# Patient Record
Sex: Female | Born: 1965
Health system: Southern US, Community
[De-identification: ages and names within clinical notes are randomized; demographics above are authoritative.]

## PROBLEM LIST (undated history)

## (undated) ENCOUNTER — Emergency Department (HOSPITAL_COMMUNITY): Admission: EM | Payer: Managed Care, Other (non HMO) | Source: Home / Self Care

## (undated) DIAGNOSIS — N898 Other specified noninflammatory disorders of vagina: Secondary | ICD-10-CM

## (undated) DIAGNOSIS — K649 Unspecified hemorrhoids: Secondary | ICD-10-CM

## (undated) DIAGNOSIS — M549 Dorsalgia, unspecified: Secondary | ICD-10-CM

## (undated) DIAGNOSIS — Z79890 Hormone replacement therapy (postmenopausal): Secondary | ICD-10-CM

## (undated) DIAGNOSIS — A4902 Methicillin resistant Staphylococcus aureus infection, unspecified site: Secondary | ICD-10-CM

## (undated) DIAGNOSIS — G8929 Other chronic pain: Secondary | ICD-10-CM

## (undated) DIAGNOSIS — J069 Acute upper respiratory infection, unspecified: Secondary | ICD-10-CM

## (undated) DIAGNOSIS — N2889 Other specified disorders of kidney and ureter: Secondary | ICD-10-CM

## (undated) DIAGNOSIS — R252 Cramp and spasm: Secondary | ICD-10-CM

## (undated) DIAGNOSIS — B009 Herpesviral infection, unspecified: Secondary | ICD-10-CM

## (undated) DIAGNOSIS — Z309 Encounter for contraceptive management, unspecified: Secondary | ICD-10-CM

## (undated) DIAGNOSIS — T148XXA Other injury of unspecified body region, initial encounter: Secondary | ICD-10-CM

## (undated) DIAGNOSIS — M1990 Unspecified osteoarthritis, unspecified site: Secondary | ICD-10-CM

## (undated) DIAGNOSIS — Z8619 Personal history of other infectious and parasitic diseases: Secondary | ICD-10-CM

## (undated) HISTORY — DX: Herpesviral infection, unspecified: B00.9

## (undated) HISTORY — DX: Encounter for contraceptive management, unspecified: Z30.9

## (undated) HISTORY — DX: Other specified disorders of kidney and ureter: N28.89

## (undated) HISTORY — DX: Other specified noninflammatory disorders of vagina: N89.8

## (undated) HISTORY — DX: Acute upper respiratory infection, unspecified: J06.9

## (undated) HISTORY — PX: ENDOMETRIAL ABLATION: SHX621

## (undated) HISTORY — PX: CHOLECYSTECTOMY: SHX55

## (undated) HISTORY — DX: Personal history of other infectious and parasitic diseases: Z86.19

## (undated) HISTORY — DX: Hormone replacement therapy: Z79.890

## (undated) HISTORY — DX: Cramp and spasm: R25.2

## (undated) HISTORY — DX: Methicillin resistant Staphylococcus aureus infection, unspecified site: A49.02

## (undated) HISTORY — DX: Unspecified hemorrhoids: K64.9

---

## 1992-12-03 DIAGNOSIS — T148XXA Other injury of unspecified body region, initial encounter: Secondary | ICD-10-CM

## 1992-12-03 HISTORY — DX: Other injury of unspecified body region, initial encounter: T14.8XXA

## 2001-09-29 ENCOUNTER — Other Ambulatory Visit: Admission: RE | Admit: 2001-09-29 | Discharge: 2001-09-29 | Payer: Self-pay | Admitting: Obstetrics and Gynecology

## 2003-08-31 ENCOUNTER — Ambulatory Visit (HOSPITAL_COMMUNITY): Admission: RE | Admit: 2003-08-31 | Discharge: 2003-08-31 | Payer: Self-pay | Admitting: Obstetrics & Gynecology

## 2004-10-27 ENCOUNTER — Emergency Department (HOSPITAL_COMMUNITY): Admission: EM | Admit: 2004-10-27 | Discharge: 2004-10-27 | Payer: Self-pay

## 2004-10-27 ENCOUNTER — Observation Stay (HOSPITAL_COMMUNITY): Admission: EM | Admit: 2004-10-27 | Discharge: 2004-10-28 | Payer: Self-pay | Admitting: Emergency Medicine

## 2008-09-25 ENCOUNTER — Emergency Department (HOSPITAL_COMMUNITY): Admission: EM | Admit: 2008-09-25 | Discharge: 2008-09-25 | Payer: Self-pay | Admitting: Emergency Medicine

## 2009-03-23 ENCOUNTER — Other Ambulatory Visit: Admission: RE | Admit: 2009-03-23 | Discharge: 2009-03-23 | Payer: Self-pay | Admitting: Obstetrics and Gynecology

## 2009-12-03 HISTORY — PX: NECK SURGERY: SHX720

## 2010-01-09 ENCOUNTER — Ambulatory Visit (HOSPITAL_COMMUNITY): Admission: RE | Admit: 2010-01-09 | Discharge: 2010-01-09 | Payer: Self-pay | Admitting: Family Medicine

## 2010-05-29 ENCOUNTER — Other Ambulatory Visit: Admission: RE | Admit: 2010-05-29 | Discharge: 2010-05-29 | Payer: Self-pay | Admitting: Obstetrics and Gynecology

## 2011-04-20 NOTE — Op Note (Signed)
   NAME:  Andrea Bailey, Andrea Bailey                            ACCOUNT NO.:  [id number]   MEDICAL RECORD NO.:  [id number]                   PATIENT TYPE:  AMB   LOCATION:  DAY                                  FACILITY:  [facility]   PHYSICIAN:  Lazaro Arms, M.D.                DATE OF BIRTH:  11/15/1966   DATE OF PROCEDURE:  08/31/2003  DATE OF DISCHARGE:                                 OPERATIVE REPORT   PREOPERATIVE DIAGNOSES:  1. Menometrorrhagia.  2. Dysmenorrhea.   POSTOPERATIVE DIAGNOSES:  1. Menometrorrhagia.  2. Dysmenorrhea.   PROCEDURE:  1. Hysteroscopy.  2. Dilatation and curettage with endometrial ablation.   SURGEON:  Lazaro Arms, M.D.   ANESTHESIA:  Laryngeal mask airway.   FINDINGS:  The patient had a normal uterus, tubes and ovaries.   DESCRIPTION OF OPERATION:  The patient was taken to the operating room and  placed in the supine position where she underwent general endotracheal  anesthesia.  She was placed in the low lithotomy position and prepped and  draped in the usual sterile fashion.  Her cervix was grasped with a single-  tooth tenaculum, 1/2% Marcaine was injected as a paracervical block, 20 cc  total.   The uterus was sounded to 9 cm.  The cervix was dilated serially to allow  passage of the hysteroscope.  Hysteroscopy was performed; it was normal  endometrial cavity.  The uterine curettage was then performed, sharply, with  a good uterine cry in all areas.  Endometrial ablation was then performed  with ThermaChoice 3.  The catheter was placed.  It required 16.5 cc of D5W  to attain a steady pressure of 190 mmHg.  It was then heated to 88 degrees  Celsius for 8 minutes.  Total therapy time of 9 minutes 32 seconds  All the  fluid was recovered at the end. The patient tolerated the procedure well.  She was awakened from anesthesia taken to the recovery room in good stable  condition. All counts were correct.       ___________________________________________                                            Lazaro Arms, M.D.   LHE/MEDQ  D:  08/31/2003  T:  08/31/2003  Job:  [id number]

## 2011-04-20 NOTE — Op Note (Signed)
Andrea Bailey, Andrea Bailey NO.:  [id number]   MEDICAL RECORD NO.:  [id number]          PATIENT TYPE:  OBV   LOCATION:  A307                          FACILITY:  [facility]   PHYSICIAN:  Dalia Heading, M.D.  DATE OF BIRTH:  1966-09-18   DATE OF PROCEDURE:  10/27/2004  DATE OF DISCHARGE:                                 OPERATIVE REPORT   PREOPERATIVE DIAGNOSIS:  Cholecystitis, cholelithiasis.   POSTOPERATIVE DIAGNOSIS:  Cholecystitis, cholelithiasis.   PROCEDURE:  Laparoscopic cholecystectomy.   SURGEON:  Dr. Franky Macho.   ANESTHESIA:  General endotracheal.   INDICATIONS:  The patient is a 45 year old white female who presents to the  emergency room with biliary colic. Ultrasound of the gallbladder reveals  cholecystitis with cholelithiasis. The patient common bile duct is within  normal limits. The risks and benefits of the procedure including bleeding,  infection, hepatobiliary injury, and the possibility of an open procedure  were fully explained to the patient who gave informed consent.   PROCEDURE NOTE:  The patient was placed in the supine position. After  induction of general endotracheal anesthesia, the abdomen was prepped and  draped using the usual sterile technique with Betadine. Surgical site  confirmation was performed.   Supraumbilical incision was made down to the fascia. Verres needle was  introduced into the abdominal cavity, and confirmation of placement was done  using the saline drop test. The abdomen was then insufflated to 16 mmHg  pressure. An 11-mm trocar was introduced into the abdominal cavity under  direct visualization without difficulty. The patient was placed in reversed  Trendelenburg position. An additional 11-mm trocar was placed in the  epigastric region. A 5-mm trocar was placed in the right upper quadrant,  right flank regions. Liver was inspected and noted to be within normal  limits. The gallbladder was noted to be inflamed and  distended. The  gallbladder was decompressed with a needle. The gallbladder was then  retracted superiorly and laterally. The dissection was begun around the  infundibulum of the gallbladder. The cystic duct was first identified. Its  juncture to the infundibulum identified. Endoclips were placed proximally  and distally on the cystic duct, and the cystic duct was divided. The cystic  artery was likewise ligated and divided. The gallbladder was freed away from  the gallbladder fossa using Bovie electrocautery. The gallbladder was  delivered through the epigastric trocar site using EndoCatch bag. The  gallbladder fossa was inspected, and no abnormal bleeding or bile leakage  was noted. Surgicel was placed in the gallbladder fossa. All fluid and air  was then evacuated from the abdominal cavity prior to removal of the  trocars.   All wounds were irrigated with normal saline. All wounds were injected with  0.5% Sensorcaine. The supraumbilical fascia as well as epigastric fascia  were reapproximated using an 0 Vicryl interrupted suture. All skin incisions  were closed using staples. Betadine ointment and dry sterile dressings were  applied.   All tape and needle counts correct at the end of the procedure. The patient  was extubated in the operating  room and went back to recovery room awake in  stable condition.   COMPLICATIONS:  None.   SPECIMENS:  Gallbladder with stones.   BLOOD LOSS:  MinimalLoraine Leriche   MAJ/MEDQ  D:  10/27/2004  T:  10/27/2004  Job:  [id number]

## 2011-05-05 ENCOUNTER — Inpatient Hospital Stay (INDEPENDENT_AMBULATORY_CARE_PROVIDER_SITE_OTHER)
Admission: RE | Admit: 2011-05-05 | Discharge: 2011-05-05 | Disposition: A | Discharge status: Home / Self Care | Payer: PRIVATE HEALTH INSURANCE | Source: Ambulatory Visit | Attending: Family Medicine | Admitting: Family Medicine

## 2011-05-05 DIAGNOSIS — M549 Dorsalgia, unspecified: Secondary | ICD-10-CM

## 2011-06-12 ENCOUNTER — Other Ambulatory Visit (HOSPITAL_COMMUNITY)
Admission: RE | Admit: 2011-06-12 | Discharge: 2011-06-12 | Disposition: A | Discharge status: Home / Self Care | Payer: PRIVATE HEALTH INSURANCE | Source: Ambulatory Visit | Attending: Obstetrics and Gynecology | Admitting: Obstetrics and Gynecology

## 2011-06-12 ENCOUNTER — Other Ambulatory Visit: Payer: Self-pay | Admitting: Adult Health

## 2011-06-12 DIAGNOSIS — Z01419 Encounter for gynecological examination (general) (routine) without abnormal findings: Secondary | ICD-10-CM | POA: Insufficient documentation

## 2011-08-23 ENCOUNTER — Encounter: Payer: Self-pay | Admitting: Gastroenterology

## 2011-08-23 ENCOUNTER — Ambulatory Visit (INDEPENDENT_AMBULATORY_CARE_PROVIDER_SITE_OTHER): Payer: PRIVATE HEALTH INSURANCE | Admitting: Gastroenterology

## 2011-08-23 VITALS — BP 121/73 | HR 70 | Temp 98.8°F | Ht 65.0 in | Wt 143.6 lb

## 2011-08-23 DIAGNOSIS — K625 Hemorrhage of anus and rectum: Secondary | ICD-10-CM

## 2011-08-23 MED ORDER — HYDROCORTISONE ACETATE 25 MG RE SUPP: 25.0000 mg | RECTAL | Status: DC

## 2011-08-23 NOTE — Patient Instructions (Signed)
Please have labs completed. You may take the suppositories for 1 week.   We have set you up for a colonoscopy with Dr. Darrick Penna.  Please seek medical attention if you have severe abdominal pain, rectal bleeding that does not stop, fever, chills, nausea or vomiting.

## 2011-08-23 NOTE — Progress Notes (Signed)
Primary Care Physician:  No primary provider on file. Referring Physician: Dr. Despina Hidden Primary Gastroenterologist:  Dr. Darrick Penna  Chief Complaint  Patient presents with  . Hemorrhoids    blood from them  . Abdominal Pain    HPI:  Andrea Bailey is a pleasant, healthy 45 year old female who presents today at the request of Dr. Despina Hidden secondary to rectal bleeding. She was seen by him in May/June, whereby she was told she had thrombosed hemorrhoids. Apparently, this was addressed in the office at that time. She has had several episodes of gross blood per rectum and clots. 3 days in a row last week. 2 days this week. No rectal discomfort. She states she has noticed lower abdominal cramping for the last few weeks. She denies any fever, chills, loss of appetite, or weight loss. No NSAIDs or aspirin powders.   She used to have a history of constipation, but for the past 3-4 weeks she has had a regular BM daily.    No significant PMH  Past Surgical History  Procedure Date  . Neck surgery   . Cholecystectomy   . Endometrial ablation     Current Outpatient Prescriptions  Medication Sig Dispense Refill  . JINTELI 1-5 MG-MCG TABS 1 tablet daily.       . valACYclovir (VALTREX) 1000 MG tablet Take 1,000 mg by mouth daily.       . hydrocortisone (ANUSOL-HC) 25 MG suppository Place 1 suppository (25 mg total) rectally 1 day or 1 dose.  7 suppository  0    Allergies as of 08/23/2011  . (No Known Allergies)    Family History  Problem Relation Age of Onset  . Colon cancer Neg Hx     History   Social History  . Marital Status: Divorced    Spouse Name: N/A    Number of Children: N/A  . Years of Education: N/A   Occupational History  . [employer]    Social History Main Topics  . Smoking status: Current Everyday Smoker -- 1.0 packs/day for 5 years  . Smokeless tobacco: Not on file  . Alcohol Use: No  . Drug Use: No  . Sexually Active: Not on file   Other Topics Concern  . Not on file   Social  History Narrative  . No narrative on file    Review of Systems: Gen: Denies any fever, chills, fatigue, weight loss, lack of appetite.  CV: Denies chest pain, heart palpitations, peripheral edema, syncope.  Resp: Denies shortness of breath at rest or with exertion. Denies wheezing or cough.  GI: Denies dysphagia or odynophagia. Denies jaundice, hematemesis, fecal incontinence. GU : Denies urinary burning, urinary frequency, urinary hesitancy MS: Denies joint pain, muscle weakness, cramps, or limitation of movement.  Derm: Denies rash, itching, dry skin Psych: Denies depression, anxiety, memory loss, and confusion Heme: Denies bruising, bleeding, and enlarged lymph nodes.  Physical Exam: BP 121/73  Pulse 70  Temp(Src) 98.8 F (37.1 C) (Temporal)  Ht 5\' 5"  (1.651 m)  Wt 143 lb 9.6 oz (65.137 kg)  BMI 23.90 kg/m2 General:   Alert and oriented. Pleasant and cooperative. Well-nourished and well-developed.  Head:  Normocephalic and atraumatic. Eyes:  Without icterus, sclera clear and conjunctiva pink.  Ears:  Normal auditory acuity. Nose:  No deformity, discharge,  or lesions. Mouth:  No deformity or lesions, oral mucosa pink.  Neck:  Supple, without mass or thyromegaly. Lungs:  Clear to auscultation bilaterally. No wheezes, rales, or rhonchi. No distress.  Heart:  S1,  S2 present without murmurs appreciated.  Abdomen:  +BS, soft, non-tender and non-distended. No HSM noted. No guarding or rebound. No masses appreciated.  Rectal:  3 large external hemorrhoid tags. Non-thrombosed. Good sphincter tone. No gross blood noted. No palpable masses.  Msk:  Symmetrical without gross deformities. Normal posture. Extremities:  Without clubbing or edema. Neurologic:  Alert and  oriented x4;  grossly normal neurologically. Skin:  Intact without significant lesions or rashes. Cervical Nodes:  No significant cervical adenopathy. Psych:  Alert and cooperative. Normal mood and affect.

## 2011-08-24 LAB — CBC WITH DIFFERENTIAL/PLATELET
Basophils Absolute: 0 10*3/uL (ref 0.0–0.1)
Basophils Relative: 0 % (ref 0–1)
Eosinophils Absolute: 0.1 10*3/uL (ref 0.0–0.7)
Eosinophils Relative: 1 % (ref 0–5)
HCT: 41.7 % (ref 36.0–46.0)
Hemoglobin: 13.9 g/dL (ref 12.0–15.0)
Lymphocytes Relative: 31 % (ref 12–46)
Lymphs Abs: 2.5 10*3/uL (ref 0.7–4.0)
MCH: 32.5 pg (ref 26.0–34.0)
MCHC: 33.3 g/dL (ref 30.0–36.0)
MCV: 97.4 fL (ref 78.0–100.0)
Monocytes Absolute: 0.6 10*3/uL (ref 0.1–1.0)
Monocytes Relative: 7 % (ref 3–12)
Neutro Abs: 4.7 10*3/uL (ref 1.7–7.7)
Neutrophils Relative %: 60 % (ref 43–77)
Platelets: 174 10*3/uL (ref 150–400)
RBC: 4.28 MIL/uL (ref 3.87–5.11)
RDW: 12.9 % (ref 11.5–15.5)
WBC: 7.9 10*3/uL (ref 4.0–10.5)

## 2011-08-27 DIAGNOSIS — K625 Hemorrhage of anus and rectum: Secondary | ICD-10-CM | POA: Insufficient documentation

## 2011-08-27 NOTE — Assessment & Plan Note (Addendum)
45 year old with no prior colonoscopy, new onset of rectal bleeding starting in May/June. Seen by Dr. Despina Hidden, thrombosed hemorrhoids addressed. Reports no rectal discomfort now, does experience lower abdominal cramping intermittently. Gross blood per rectum several days each week, clots noted as well. No diarrhea, fever, chills, loss of appetite, or weight loss. May be due to benign anorectal source, but due to new onset rectal bleeding, needs further evaluation of lower GI tract.   Proceed with colonoscopy with Dr. Darrick Penna in the near future. The risks, benefits, and alternatives have been discussed in detail with the patient. They state understanding and desire to proceed.  Anusol suppositories in interim X 1 week CBC for baseline

## 2011-08-27 NOTE — Progress Notes (Signed)
Quick Note:  Pt returned call and was informed. ______ 

## 2011-08-28 NOTE — Progress Notes (Signed)
Cc to Dr Eure 

## 2011-09-04 MED ORDER — SODIUM CHLORIDE 0.45 % IV SOLN
Freq: Once | INTRAVENOUS | Status: AC
  Administered 2011-09-05: 11:00:00 via INTRAVENOUS

## 2011-09-05 ENCOUNTER — Ambulatory Visit (HOSPITAL_COMMUNITY)
Admission: RE | Admit: 2011-09-05 | Discharge: 2011-09-05 | Disposition: A | Discharge status: Home / Self Care | Payer: PRIVATE HEALTH INSURANCE | Source: Ambulatory Visit | Attending: Gastroenterology | Admitting: Gastroenterology

## 2011-09-05 ENCOUNTER — Encounter (HOSPITAL_COMMUNITY): Payer: Self-pay | Admitting: *Deleted

## 2011-09-05 ENCOUNTER — Encounter (HOSPITAL_COMMUNITY)
Admission: RE | Disposition: A | Discharge status: Home / Self Care | Payer: Self-pay | Source: Ambulatory Visit | Attending: Gastroenterology

## 2011-09-05 DIAGNOSIS — K648 Other hemorrhoids: Secondary | ICD-10-CM | POA: Insufficient documentation

## 2011-09-05 DIAGNOSIS — K5730 Diverticulosis of large intestine without perforation or abscess without bleeding: Secondary | ICD-10-CM | POA: Insufficient documentation

## 2011-09-05 DIAGNOSIS — K625 Hemorrhage of anus and rectum: Secondary | ICD-10-CM

## 2011-09-05 HISTORY — PX: COLONOSCOPY: SHX5424

## 2011-09-05 SURGERY — COLONOSCOPY
Anesthesia: Moderate Sedation

## 2011-09-05 MED ORDER — MEPERIDINE HCL 100 MG/ML IJ SOLN
INTRAMUSCULAR | Status: AC
  Filled 2011-09-05: qty 1

## 2011-09-05 MED ORDER — STERILE WATER FOR IRRIGATION IR SOLN
Status: DC | PRN
  Administered 2011-09-05: 11:00:00

## 2011-09-05 MED ORDER — MEPERIDINE HCL 100 MG/ML IJ SOLN
INTRAMUSCULAR | Status: DC | PRN
  Administered 2011-09-05: 50 mg
  Administered 2011-09-05: 25 mg

## 2011-09-05 MED ORDER — MIDAZOLAM HCL 5 MG/5ML IJ SOLN
INTRAMUSCULAR | Status: DC | PRN
  Administered 2011-09-05 (×2): 2 mg via INTRAVENOUS
  Administered 2011-09-05: 1 mg via INTRAVENOUS

## 2011-09-05 MED ORDER — MIDAZOLAM HCL 5 MG/5ML IJ SOLN
INTRAMUSCULAR | Status: AC
  Filled 2011-09-05: qty 10

## 2011-09-05 NOTE — OR Nursing (Signed)
BP down to 88/36. Fluids at 967ml/hr to increase BP.

## 2011-09-05 NOTE — H&P (Signed)
Reason for Visit     Hemorrhoids    blood from them    Abdominal Pain       Reason For Visit History Recorded        Vitals - Last Recorded       BP Pulse Temp(Src) Ht Wt BMI    121/73  70  98.8 F (37.1 C) (Temporal)  5\' 5"  (1.651 m)  143 lb 9.6 oz (65.137 kg)  23.90 kg/m2          Progress Notes     Gerrit Halls, NP  08/27/2011  5:00 PM  Signed   Primary Care Physician:  No primary provider on file. Referring Physician: Dr. Despina Hidden Primary Gastroenterologist:  Dr. Darrick Penna    Chief Complaint   Patient presents with   .  Hemorrhoids       blood from them   .  Abdominal Pain      HPI:   Andrea Bailey is a pleasant, healthy 45 year old female who presents today at the request of Dr. Despina Hidden secondary to rectal bleeding. She was seen by him in May/June, whereby she was told she had thrombosed hemorrhoids. Apparently, this was addressed in the office at that time. She has had several episodes of gross blood per rectum and clots. 3 days in a row last week. 2 days this week. No rectal discomfort. She states she has noticed lower abdominal cramping for the last few weeks. She denies any fever, chills, loss of appetite, or weight loss. No NSAIDs or aspirin powders.    She used to have a history of constipation, but for the past 3-4 weeks she has had a regular BM daily.      No significant PMH    Past Surgical History   Procedure  Date   .  Neck surgery     .  Cholecystectomy     .  Endometrial ablation         Current Outpatient Prescriptions   Medication  Sig  Dispense  Refill   .  JINTELI 1-5 MG-MCG TABS  1 tablet daily.          .  valACYclovir (VALTREX) 1000 MG tablet  Take 1,000 mg by mouth daily.          .  hydrocortisone (ANUSOL-HC) 25 MG suppository  Place 1 suppository (25 mg total) rectally 1 day or 1 dose.   7 suppository   0       Allergies as of 08/23/2011   .  (No Known Allergies)       Family History   Problem  Relation  Age of Onset   .  Colon cancer  Neg Hx          History       Social History   .  Marital Status:  Divorced       Spouse Name:  N/A       Number of Children:  N/A   .  Years of Education:  N/A       Occupational History   .  [employer]         Social History Main Topics   .  Smoking status:  Current Everyday Smoker -- 1.0 packs/day for 5 years   .  Smokeless tobacco:  Not on file   .  Alcohol Use:  No   .  Drug Use:  No   .  Sexually Active:  Not on file  Other Topics  Concern   .  Not on file       Social History Narrative   .  No narrative on file      Review of Systems: Gen: Denies any fever, chills, fatigue, weight loss, lack of appetite.   CV: Denies chest pain, heart palpitations, peripheral edema, syncope.   Resp: Denies shortness of breath at rest or with exertion. Denies wheezing or cough.   GI: Denies dysphagia or odynophagia. Denies jaundice, hematemesis, fecal incontinence. GU : Denies urinary burning, urinary frequency, urinary hesitancy MS: Denies joint pain, muscle weakness, cramps, or limitation of movement.   Derm: Denies rash, itching, dry skin Psych: Denies depression, anxiety, memory loss, and confusion Heme: Denies bruising, bleeding, and enlarged lymph nodes.   Physical Exam: BP 121/73  Pulse 70  Temp(Src) 98.8 F (37.1 C) (Temporal)  Ht 5\' 5"  (1.651 m)  Wt 143 lb 9.6 oz (65.137 kg)  BMI 23.90 kg/m2 General:   Alert and oriented. Pleasant and cooperative. Well-nourished and well-developed.   Head:  Normocephalic and atraumatic. Eyes:  Without icterus, sclera clear and conjunctiva pink.   Ears:  Normal auditory acuity. Nose:  No deformity, discharge,  or lesions. Mouth:  No deformity or lesions, oral mucosa pink.   Neck:  Supple, without mass or thyromegaly. Lungs:  Clear to auscultation bilaterally. No wheezes, rales, or rhonchi. No distress.   Heart:  S1, S2 present without murmurs appreciated.   Abdomen:  +BS, soft, non-tender and non-distended. No HSM noted. No guarding  or rebound. No masses appreciated.   Rectal:  3 large external hemorrhoid tags. Non-thrombosed. Good sphincter tone. No gross blood noted. No palpable masses.  Msk:  Symmetrical without gross deformities. Normal posture. Extremities:  Without clubbing or edema. Neurologic:  Alert and  oriented x4;  grossly normal neurologically. Skin:  Intact without significant lesions or rashes. Cervical Nodes:  No significant cervical adenopathy. Psych:  Alert and cooperative. Normal mood and affect.   Cloria Spring, LPN, LPN  1/61/0960  1:58 PM  Signed Quick Note:   Pt returned call and was informed. ______  Glendora Score  08/28/2011  8:03 AM  Signed Cc to Dr. Despina Hidden        Rectal bleeding - Gerrit Halls, NP  08/27/2011  5:00 PM  Addendum 45 year old with no prior colonoscopy, new onset of rectal bleeding starting in May/June. Seen by Dr. Despina Hidden, thrombosed hemorrhoids addressed. Reports no rectal discomfort now, does experience lower abdominal cramping intermittently. Gross blood per rectum several days each week, clots noted as well. No diarrhea, fever, chills, loss of appetite, or weight loss. May be due to benign anorectal source, but due to new onset rectal bleeding, needs further evaluation of lower GI tract.    Proceed with colonoscopy with Dr. Darrick Penna in the near future. The risks, benefits, and alternatives have been discussed in detail with the patient. They state understanding and desire to proceed.   Anusol suppositories in interim X 1 week CBC for baseline

## 2011-09-05 NOTE — Interval H&P Note (Signed)
History and Physical Interval Note:   09/05/2011   10:42 AM   Andrea Bailey  has presented today for surgery, with the diagnosis of rectal bleeding  The various methods of treatment have been discussed with the patient and family. After consideration of risks, benefits and other options for treatment, the patient has consented to  Procedure(s): COLONOSCOPY as a surgical intervention .  I have reviewed the patients' chart and labs.  Questions were answered to the patient's satisfaction.     Jonette Eva  MD

## 2011-09-12 ENCOUNTER — Encounter (HOSPITAL_COMMUNITY): Payer: Self-pay | Admitting: Gastroenterology

## 2011-10-29 NOTE — Progress Notes (Signed)
ILEOTCS OCT 2012 MOD IH

## 2011-12-03 ENCOUNTER — Other Ambulatory Visit (HOSPITAL_COMMUNITY): Payer: Self-pay | Admitting: Preventative Medicine

## 2011-12-03 DIAGNOSIS — M5416 Radiculopathy, lumbar region: Secondary | ICD-10-CM

## 2011-12-11 ENCOUNTER — Ambulatory Visit (HOSPITAL_COMMUNITY)
Admission: RE | Admit: 2011-12-11 | Discharge: 2011-12-11 | Disposition: A | Discharge status: Home / Self Care | Payer: 59 | Source: Ambulatory Visit | Attending: Preventative Medicine | Admitting: Preventative Medicine

## 2011-12-11 DIAGNOSIS — M5126 Other intervertebral disc displacement, lumbar region: Secondary | ICD-10-CM | POA: Insufficient documentation

## 2011-12-11 DIAGNOSIS — M5416 Radiculopathy, lumbar region: Secondary | ICD-10-CM

## 2011-12-11 DIAGNOSIS — M545 Low back pain, unspecified: Secondary | ICD-10-CM | POA: Insufficient documentation

## 2011-12-11 DIAGNOSIS — R209 Unspecified disturbances of skin sensation: Secondary | ICD-10-CM | POA: Insufficient documentation

## 2011-12-20 ENCOUNTER — Encounter (HOSPITAL_COMMUNITY): Payer: Self-pay | Admitting: Pharmacy Technician

## 2011-12-27 ENCOUNTER — Encounter (HOSPITAL_COMMUNITY): Payer: Self-pay

## 2011-12-27 ENCOUNTER — Encounter (HOSPITAL_COMMUNITY)
Admission: RE | Admit: 2011-12-27 | Discharge: 2011-12-27 | Disposition: A | Discharge status: Home / Self Care | Payer: Managed Care, Other (non HMO) | Source: Ambulatory Visit | Attending: Neurosurgery | Admitting: Neurosurgery

## 2011-12-27 HISTORY — DX: Other injury of unspecified body region, initial encounter: T14.8XXA

## 2011-12-27 HISTORY — DX: Dorsalgia, unspecified: M54.9

## 2011-12-27 LAB — CBC
Hemoglobin: 14.1 g/dL (ref 12.0–15.0)
MCV: 95.7 fL (ref 78.0–100.0)
Platelets: 183 10*3/uL (ref 150–400)
RBC: 4.23 MIL/uL (ref 3.87–5.11)
WBC: 10.5 10*3/uL (ref 4.0–10.5)

## 2011-12-27 LAB — SURGICAL PCR SCREEN: MRSA, PCR: NEGATIVE

## 2011-12-27 NOTE — Progress Notes (Signed)
Requested orders for pt., left message for Erie Noe at Dr. Trudee Grip office.

## 2011-12-27 NOTE — Pre-Procedure Instructions (Signed)
20 VIENNA FOLDEN  12/27/2011   Your procedure is scheduled on:  Fri, Jan 25 @ 2:25pm  Report to [facility] at 12:15 PM.  Call this number if you have problems the morning of surgery: [phone]   Remember:   Do not eat food:After Midnight.  May have clear liquids: up to 4 Hours before arrival.(until 8:15 am)  Clear liquids include soda, tea, black coffee, apple or grape juice, broth.  Take these medicines the morning of surgery with A SIP OF WATER: Valtrex   Do not wear jewelry, make-up or nail polish.  Do not wear lotions, powders, or perfumes. You may wear deodorant.  Do not shave 48 hours prior to surgery.  Do not bring valuables to the hospital.  Contacts, dentures or bridgework may not be worn into surgery.  Leave suitcase in the car. After surgery it may be brought to your room.  For patients admitted to the hospital, checkout time is 11:00 AM the day of discharge.   Patients discharged the day of surgery will not be allowed to drive home.  Name and phone number of your driver:   Special Instructions: CHG Shower Use Special Wash: 1/2 bottle night before surgery and 1/2 bottle morning of surgery.   Please read over the following fact sheets that you were given: Pain Booklet, Coughing and Deep Breathing, MRSA Information and Surgical Site Infection Prevention

## 2011-12-27 NOTE — Progress Notes (Signed)
Pt has never had a heart cath/echo/stress test

## 2011-12-28 ENCOUNTER — Ambulatory Visit (HOSPITAL_COMMUNITY)
Admission: RE | Admit: 2011-12-28 | Discharge: 2011-12-29 | Disposition: A | Discharge status: Home / Self Care | Payer: Managed Care, Other (non HMO) | Source: Ambulatory Visit | Attending: Neurosurgery | Admitting: Neurosurgery

## 2011-12-28 ENCOUNTER — Ambulatory Visit (HOSPITAL_COMMUNITY): Payer: Managed Care, Other (non HMO)

## 2011-12-28 ENCOUNTER — Encounter (HOSPITAL_COMMUNITY): Payer: Self-pay | Admitting: Certified Registered"

## 2011-12-28 ENCOUNTER — Encounter (HOSPITAL_COMMUNITY)
Admission: RE | Disposition: A | Discharge status: Home / Self Care | Payer: Self-pay | Source: Ambulatory Visit | Attending: Neurosurgery

## 2011-12-28 ENCOUNTER — Ambulatory Visit (HOSPITAL_COMMUNITY): Payer: Managed Care, Other (non HMO) | Admitting: Certified Registered"

## 2011-12-28 ENCOUNTER — Other Ambulatory Visit (HOSPITAL_COMMUNITY): Payer: Self-pay | Admitting: *Deleted

## 2011-12-28 DIAGNOSIS — Z01812 Encounter for preprocedural laboratory examination: Secondary | ICD-10-CM | POA: Insufficient documentation

## 2011-12-28 DIAGNOSIS — M5126 Other intervertebral disc displacement, lumbar region: Secondary | ICD-10-CM

## 2011-12-28 HISTORY — PX: LUMBAR LAMINECTOMY/DECOMPRESSION MICRODISCECTOMY: SHX5026

## 2011-12-28 SURGERY — LUMBAR LAMINECTOMY/DECOMPRESSION MICRODISCECTOMY
Anesthesia: General | Site: Back | Laterality: Right

## 2011-12-28 MED ORDER — MENTHOL 3 MG MT LOZG: 1.0000 | LOZENGE | OROMUCOSAL | Status: DC | PRN

## 2011-12-28 MED ORDER — VANCOMYCIN HCL 500 MG IV SOLR
500.0000 mg | INTRAVENOUS | Status: AC
  Administered 2011-12-28: 500 mg via INTRAVENOUS
  Filled 2011-12-28 (×2): qty 500

## 2011-12-28 MED ORDER — HYDROMORPHONE HCL PF 1 MG/ML IJ SOLN: 0.2500 mg | INTRAMUSCULAR | Status: DC | PRN

## 2011-12-28 MED ORDER — ONDANSETRON HCL 4 MG/2ML IJ SOLN
INTRAMUSCULAR | Status: DC | PRN
  Administered 2011-12-28: 4 mg via INTRAVENOUS

## 2011-12-28 MED ORDER — VANCOMYCIN HCL IN DEXTROSE 1-5 GM/200ML-% IV SOLN
1000.0000 mg | Freq: Once | INTRAVENOUS | Status: AC
  Administered 2011-12-29: 1000 mg via INTRAVENOUS
  Filled 2011-12-28: qty 200

## 2011-12-28 MED ORDER — SODIUM CHLORIDE 0.9 % IR SOLN
Status: DC | PRN
  Administered 2011-12-28: 17:00:00

## 2011-12-28 MED ORDER — NEOSTIGMINE METHYLSULFATE 1 MG/ML IJ SOLN
INTRAMUSCULAR | Status: DC | PRN
  Administered 2011-12-28: 3 mg via INTRAVENOUS

## 2011-12-28 MED ORDER — SODIUM CHLORIDE 0.9 % IV SOLN
INTRAVENOUS | Status: AC
  Filled 2011-12-28: qty 500

## 2011-12-28 MED ORDER — SODIUM CHLORIDE 0.9 % IJ SOLN: 3.0000 mL | Freq: Two times a day (BID) | INTRAMUSCULAR | Status: DC

## 2011-12-28 MED ORDER — PHENOL 1.4 % MT LIQD: 1.0000 | OROMUCOSAL | Status: DC | PRN

## 2011-12-28 MED ORDER — TOBRAMYCIN SULFATE 80 MG/2ML IJ SOLN
80.0000 mg | INTRAVENOUS | Status: AC
  Administered 2011-12-28: 80 mg via INTRAVENOUS
  Filled 2011-12-28 (×2): qty 2

## 2011-12-28 MED ORDER — MEPERIDINE HCL 25 MG/ML IJ SOLN: 6.2500 mg | INTRAMUSCULAR | Status: DC | PRN

## 2011-12-28 MED ORDER — KETOROLAC TROMETHAMINE 30 MG/ML IJ SOLN
30.0000 mg | Freq: Four times a day (QID) | INTRAMUSCULAR | Status: AC
  Administered 2011-12-28 – 2011-12-29 (×2): 30 mg via INTRAVENOUS
  Filled 2011-12-28 (×2): qty 1

## 2011-12-28 MED ORDER — ONDANSETRON HCL 4 MG/2ML IJ SOLN: 4.0000 mg | INTRAMUSCULAR | Status: DC | PRN

## 2011-12-28 MED ORDER — SODIUM CHLORIDE 0.9 % IJ SOLN: 3.0000 mL | INTRAMUSCULAR | Status: DC | PRN

## 2011-12-28 MED ORDER — BACITRACIN ZINC 500 UNIT/GM EX OINT
TOPICAL_OINTMENT | CUTANEOUS | Status: DC | PRN
  Administered 2011-12-28: 1 via TOPICAL

## 2011-12-28 MED ORDER — VALACYCLOVIR HCL 500 MG PO TABS
1000.0000 mg | ORAL_TABLET | Freq: Every day | ORAL | Status: DC
  Administered 2011-12-28: 1000 mg via ORAL
  Filled 2011-12-28 (×2): qty 2

## 2011-12-28 MED ORDER — ACETAMINOPHEN 325 MG PO TABS: 650.0000 mg | ORAL_TABLET | ORAL | Status: DC | PRN

## 2011-12-28 MED ORDER — KCL IN DEXTROSE-NACL 20-5-0.45 MEQ/L-%-% IV SOLN
INTRAVENOUS | Status: AC
  Filled 2011-12-28: qty 1000

## 2011-12-28 MED ORDER — THROMBIN 5000 UNITS EX SOLR
CUTANEOUS | Status: DC | PRN
  Administered 2011-12-28 (×2): 5000 [IU] via TOPICAL

## 2011-12-28 MED ORDER — ONDANSETRON HCL 4 MG/2ML IJ SOLN: 4.0000 mg | Freq: Once | INTRAMUSCULAR | Status: DC | PRN

## 2011-12-28 MED ORDER — ROCURONIUM BROMIDE 100 MG/10ML IV SOLN
INTRAVENOUS | Status: DC | PRN
  Administered 2011-12-28: 50 mg via INTRAVENOUS

## 2011-12-28 MED ORDER — DIPHENHYDRAMINE HCL 50 MG/ML IJ SOLN
INTRAMUSCULAR | Status: AC
  Administered 2011-12-28 (×2): 25 mg via INTRAVENOUS
  Filled 2011-12-28: qty 1

## 2011-12-28 MED ORDER — MORPHINE SULFATE 2 MG/ML IJ SOLN: 0.0500 mg/kg | INTRAMUSCULAR | Status: DC | PRN

## 2011-12-28 MED ORDER — KCL IN DEXTROSE-NACL 20-5-0.45 MEQ/L-%-% IV SOLN
80.0000 mL/h | INTRAVENOUS | Status: DC
  Filled 2011-12-28 (×3): qty 1000

## 2011-12-28 MED ORDER — MIDAZOLAM HCL 5 MG/5ML IJ SOLN
INTRAMUSCULAR | Status: DC | PRN
  Administered 2011-12-28: 2 mg via INTRAVENOUS

## 2011-12-28 MED ORDER — CYCLOBENZAPRINE HCL 10 MG PO TABS
10.0000 mg | ORAL_TABLET | Freq: Three times a day (TID) | ORAL | Status: DC | PRN
  Administered 2011-12-28: 10 mg via ORAL
  Filled 2011-12-28: qty 1

## 2011-12-28 MED ORDER — DIPHENHYDRAMINE HCL 50 MG/ML IJ SOLN: 50.0000 mg | Freq: Once | INTRAMUSCULAR | Status: DC

## 2011-12-28 MED ORDER — ACETAMINOPHEN 650 MG RE SUPP: 650.0000 mg | RECTAL | Status: DC | PRN

## 2011-12-28 MED ORDER — DEXAMETHASONE SODIUM PHOSPHATE 4 MG/ML IJ SOLN
4.0000 mg | Freq: Four times a day (QID) | INTRAMUSCULAR | Status: AC
  Administered 2011-12-28 – 2011-12-29 (×2): 4 mg via INTRAVENOUS
  Filled 2011-12-28 (×2): qty 1

## 2011-12-28 MED ORDER — PROPOFOL 10 MG/ML IV EMUL
INTRAVENOUS | Status: DC | PRN
  Administered 2011-12-28: 200 mg via INTRAVENOUS

## 2011-12-28 MED ORDER — HYDROMORPHONE HCL PF 1 MG/ML IJ SOLN
INTRAMUSCULAR | Status: AC
  Filled 2011-12-28: qty 1

## 2011-12-28 MED ORDER — KETOROLAC TROMETHAMINE 30 MG/ML IJ SOLN
INTRAMUSCULAR | Status: DC | PRN
  Administered 2011-12-28: 30 mg via INTRAVENOUS

## 2011-12-28 MED ORDER — BUPIVACAINE HCL (PF) 0.5 % IJ SOLN
INTRAMUSCULAR | Status: DC | PRN
  Administered 2011-12-28: 9 mL

## 2011-12-28 MED ORDER — SUFENTANIL CITRATE 50 MCG/ML IV SOLN
INTRAVENOUS | Status: DC | PRN
  Administered 2011-12-28: 20 ug via INTRAVENOUS
  Administered 2011-12-28: 10 ug via INTRAVENOUS

## 2011-12-28 MED ORDER — HYDROMORPHONE HCL PF 1 MG/ML IJ SOLN: 1.0000 mg | INTRAMUSCULAR | Status: DC | PRN

## 2011-12-28 MED ORDER — HYDROCODONE-ACETAMINOPHEN 5-325 MG PO TABS
1.0000 | ORAL_TABLET | ORAL | Status: DC | PRN
  Administered 2011-12-28: 2 via ORAL
  Filled 2011-12-28: qty 2

## 2011-12-28 MED ORDER — SODIUM CHLORIDE 0.9 % IV SOLN: 250.0000 mL | INTRAVENOUS | Status: DC

## 2011-12-28 MED ORDER — 0.9 % SODIUM CHLORIDE (POUR BTL) OPTIME
TOPICAL | Status: DC | PRN
  Administered 2011-12-28: 1000 mL

## 2011-12-28 MED ORDER — ZOLPIDEM TARTRATE 10 MG PO TABS: 10.0000 mg | ORAL_TABLET | Freq: Every evening | ORAL | Status: DC | PRN

## 2011-12-28 MED ORDER — DEXAMETHASONE SODIUM PHOSPHATE 10 MG/ML IJ SOLN
INTRAMUSCULAR | Status: AC
  Administered 2011-12-28: 10 mg via INTRAVENOUS
  Filled 2011-12-28: qty 1

## 2011-12-28 MED ORDER — DEXAMETHASONE 4 MG PO TABS: 4.0000 mg | ORAL_TABLET | Freq: Four times a day (QID) | ORAL | Status: AC

## 2011-12-28 MED ORDER — GLYCOPYRROLATE 0.2 MG/ML IJ SOLN
INTRAMUSCULAR | Status: DC | PRN
  Administered 2011-12-28: .6 mg via INTRAVENOUS

## 2011-12-28 MED ORDER — LACTATED RINGERS IV SOLN
INTRAVENOUS | Status: DC | PRN
  Administered 2011-12-28 (×2): via INTRAVENOUS

## 2011-12-28 MED ORDER — DEXAMETHASONE SODIUM PHOSPHATE 10 MG/ML IJ SOLN: 10.0000 mg | Freq: Once | INTRAMUSCULAR | Status: DC

## 2011-12-28 MED ORDER — EPHEDRINE SULFATE 50 MG/ML IJ SOLN
INTRAMUSCULAR | Status: DC | PRN
  Administered 2011-12-28: 10 mg via INTRAVENOUS

## 2011-12-28 MED ORDER — HEMOSTATIC AGENTS (NO CHARGE) OPTIME
TOPICAL | Status: DC | PRN
  Administered 2011-12-28: 1 via TOPICAL

## 2011-12-28 MED ORDER — BACITRACIN 50000 UNITS IM SOLR
INTRAMUSCULAR | Status: AC
  Filled 2011-12-28: qty 1

## 2011-12-28 SURGICAL SUPPLY — 45 items
APL SKNCLS STERI-STRIP NONHPOA (GAUZE/BANDAGES/DRESSINGS) ×1
BAG DECANTER FOR FLEXI CONT (MISCELLANEOUS) ×2 IMPLANT
BENZOIN TINCTURE PRP APPL 2/3 (GAUZE/BANDAGES/DRESSINGS) ×2 IMPLANT
BLADE SURG ROTATE 9660 (MISCELLANEOUS) ×2 IMPLANT
BRUSH SCRUB EZ PLAIN DRY (MISCELLANEOUS) ×2 IMPLANT
CANISTER SUCTION 2500CC (MISCELLANEOUS) ×2 IMPLANT
CLOTH BEACON ORANGE TIMEOUT ST (SAFETY) ×2 IMPLANT
CONT SPEC 4OZ CLIKSEAL STRL BL (MISCELLANEOUS) ×2 IMPLANT
DRAPE LAPAROTOMY 100X72X124 (DRAPES) ×2 IMPLANT
DRAPE MICROSCOPE ZEISS OPMI (DRAPES) ×2 IMPLANT
DRAPE POUCH INSTRU U-SHP 10X18 (DRAPES) ×2 IMPLANT
DRAPE SURG 17X23 STRL (DRAPES) ×4 IMPLANT
DRESSING TELFA 8X3 (GAUZE/BANDAGES/DRESSINGS) ×2 IMPLANT
ELECT REM PT RETURN 9FT ADLT (ELECTROSURGICAL) ×2
ELECTRODE REM PT RTRN 9FT ADLT (ELECTROSURGICAL) ×1 IMPLANT
GAUZE SPONGE 4X4 16PLY XRAY LF (GAUZE/BANDAGES/DRESSINGS) IMPLANT
GLOVE BIOGEL M 8.0 STRL (GLOVE) ×2 IMPLANT
GLOVE BIOGEL PI IND STRL 6.5 (GLOVE) ×1 IMPLANT
GLOVE BIOGEL PI INDICATOR 6.5 (GLOVE) ×1
GLOVE ECLIPSE 7.5 STRL STRAW (GLOVE) ×4 IMPLANT
GLOVE INDICATOR 7.0 STRL GRN (GLOVE) ×2 IMPLANT
GLOVE INDICATOR 8.0 STRL GRN (GLOVE) ×2 IMPLANT
GOWN BRE IMP SLV AUR LG STRL (GOWN DISPOSABLE) ×2 IMPLANT
GOWN BRE IMP SLV AUR XL STRL (GOWN DISPOSABLE) ×2 IMPLANT
GOWN STRL REIN 2XL LVL4 (GOWN DISPOSABLE) ×2 IMPLANT
KIT BASIN OR (CUSTOM PROCEDURE TRAY) ×2 IMPLANT
KIT ROOM TURNOVER OR (KITS) ×2 IMPLANT
NEEDLE HYPO 22GX1.5 SAFETY (NEEDLE) ×2 IMPLANT
NEEDLE SPNL 22GX3.5 QUINCKE BK (NEEDLE) ×2 IMPLANT
NS IRRIG 1000ML POUR BTL (IV SOLUTION) ×2 IMPLANT
PACK LAMINECTOMY NEURO (CUSTOM PROCEDURE TRAY) ×2 IMPLANT
PAD ARMBOARD 7.5X6 YLW CONV (MISCELLANEOUS) ×6 IMPLANT
PATTIES SURGICAL .75X.75 (GAUZE/BANDAGES/DRESSINGS) ×2 IMPLANT
RUBBERBAND STERILE (MISCELLANEOUS) ×4 IMPLANT
SPONGE GAUZE 4X4 12PLY (GAUZE/BANDAGES/DRESSINGS) ×2 IMPLANT
SPONGE SURGIFOAM ABS GEL SZ50 (HEMOSTASIS) ×2 IMPLANT
STAPLER SKIN PROX WIDE 3.9 (STAPLE) ×2 IMPLANT
STRIP CLOSURE SKIN 1/2X4 (GAUZE/BANDAGES/DRESSINGS) ×2 IMPLANT
SUT VIC AB 2-0 OS6 18 (SUTURE) ×6 IMPLANT
SUT VIC AB 3-0 CP2 18 (SUTURE) ×2 IMPLANT
SYR 20ML ECCENTRIC (SYRINGE) ×2 IMPLANT
TOOL FLUTED BALL 7MM (MISCELLANEOUS) ×2 IMPLANT
TOWEL OR 17X24 6PK STRL BLUE (TOWEL DISPOSABLE) ×2 IMPLANT
TOWEL OR 17X26 10 PK STRL BLUE (TOWEL DISPOSABLE) ×2 IMPLANT
WATER STERILE IRR 1000ML POUR (IV SOLUTION) ×2 IMPLANT

## 2011-12-28 NOTE — Anesthesia Postprocedure Evaluation (Signed)
  Anesthesia Post-op Note  Patient: Andrea Bailey  Procedure(s) Performed:  LUMBAR LAMINECTOMY/DECOMPRESSION MICRODISCECTOMY - LUMBAR LAMINECTOMY DECOMPRESSION MICRODISCECTOMY LUMBAR 5-SACRAL ONE  Patient Location: PACU  Anesthesia Type: General  Level of Consciousness: awake, oriented and patient cooperative  Airway and Oxygen Therapy: Patient Spontanous Breathing and Patient connected to nasal cannula oxygen  Post-op Pain: mild  Post-op Assessment: Post-op Vital signs reviewed, Patient's Cardiovascular Status Stable, Respiratory Function Stable, Patent Airway, No signs of Nausea or vomiting and Pain level controlled  Post-op Vital Signs: stable  Complications: No apparent anesthesia complications

## 2011-12-28 NOTE — Op Note (Signed)
Preop diagnosis: HNP L5-S1 right Postoperative diagnoses: Same Procedure: Right L5-S1 intralaminar laminotomy for excision of herniated disc Surgeon: Chaselyn Nanney Assistant: [place]  After being placed in the prone position the patient's back was prepped and draped in the usual sterile fashion. Utilizing x-ray was taken prior to incision to identify the appropriate level. Midline incision was made above the spinous processes of L5 and S1. Using Bovie cutting current incision was carried on the spinous processes. Subperiosteal dissection was carried out on the right side of the spinous processes and lamina and self-retaining retractor was placed for exposure. X-ray showed approach the appropriate level. Using the high-speed drill the inferior one third of the L5 lamina and the medial one third of the facet joint were removed. Drill was then used to remove the superior two thirds of the S1 lamina and the S1 nerve root was generously followed out its foramen. At this time ligamentum flavum was removed to complete the decompression. The microscope was then draped and brought into the field. Using microsecond dissection technique the lateral aspect of the thecal sac and S1 nerve root was carried out. The disc was found to markedly herniated. Inferior disc material was found under the thecal sac and out the foramen the S1 nerve root. These fragments of disc were removed. The disc space was then incised with a 15 blade and thoroughly cleaned out with pituitary rongeurs and curettes. At this time attention was carried out directions and no evidence of compression could be identified. Then irrigated copiously with antibiotic irrigation. Any bleeding was controlled with bipolar coagulation and Gelfoam. The wound was then closed in multiple layers of Vicryl on the muscle fascia subcutaneous and subcuticular tissues. Staples were placed on the skin. A sterile dressing was applied and the patient was extubated and taken to  recovery room in stable condition

## 2011-12-28 NOTE — Anesthesia Preprocedure Evaluation (Addendum)
Anesthesia Evaluation  Patient identified by MRN, date of birth, ID band Patient awake    Reviewed: Allergy & Precautions, H&P , NPO status , Patient's Chart, lab work & pertinent test results  Airway Mallampati: I TM Distance: >3 FB Neck ROM: Full    Dental  (+) Teeth Intact and Dental Advisory Given   Pulmonary  clear to auscultation        Cardiovascular Regular Normal    Neuro/Psych    GI/Hepatic   Endo/Other    Renal/GU      Musculoskeletal   Abdominal   Peds  Hematology   Anesthesia Other Findings   Reproductive/Obstetrics                           Anesthesia Physical Anesthesia Plan  ASA: I  Anesthesia Plan: General   Post-op Pain Management:    Induction: Intravenous  Airway Management Planned: Oral ETT  Additional Equipment:   Intra-op Plan:   Post-operative Plan: Extubation in OR  Informed Consent: I have reviewed the patients History and Physical, chart, labs and discussed the procedure including the risks, benefits and alternatives for the proposed anesthesia with the patient or authorized representative who has indicated his/her understanding and acceptance.   Dental advisory given  Plan Discussed with: CRNA, Anesthesiologist and Surgeon  Anesthesia Plan Comments:         Anesthesia Quick Evaluation  

## 2011-12-28 NOTE — Progress Notes (Signed)
ANTIBIOTIC CONSULT NOTE - INITIAL  Pharmacy Consult for Vancomycin Indication: post-op surgical prophylaxis  No Known Allergies  Patient Measurements:   Adjusted Body Weight:   Vital Signs: Temp: 97.4 F (36.3 C) (01/25 1942) Temp src: Oral (01/25 1942) BP: 122/79 mmHg (01/25 1942) Pulse Rate: 57  (01/25 1942) Intake/Output from previous day:   Intake/Output from this shift:    Labs:  [facility] 12/27/11 0936  WBC 10.5  HGB 14.1  PLT 183  LABCREA --  CREATININE --   CrCl is unknown because no creatinine reading has been taken. No results found for this basename: VANCOTROUGH:2,VANCOPEAK:2,VANCORANDOM:2,GENTTROUGH:2,GENTPEAK:2,GENTRANDOM:2,TOBRATROUGH:2,TOBRAPEAK:2,TOBRARND:2,AMIKACINPEAK:2,AMIKACINTROU:2,AMIKACIN:2, in the last 72 hours   Microbiology: Recent Results (from the past 720 hour(s))  SURGICAL PCR SCREEN     Status: Normal   Collection Time   12/27/11  9:32 AM      Component Value Range Status Comment   MRSA, PCR NEGATIVE  NEGATIVE  Final    Staphylococcus aureus NEGATIVE  NEGATIVE  Final     Medical History: Past Medical History  Diagnosis Date  . Hemorrhoids   . Hematoma 1994    after birth of second child  . Back pain     buldging pain   Assessment: 45yof s/p spinal surgery to receive Vancomycin x 1 dose post op. Pt has no drains per op note. Pt received Vancomycin 500mg  pre-op (~ 1700). Wt 67.1kg  Goal of Therapy:  Vancomycin trough level 10-15 mcg/ml  Plan:  1. Vancomycin 1g IV x 1 @ 0300 tomorrow AM  Cleon Dew [phone] 12/28/2011,10:00 PM

## 2011-12-28 NOTE — H&P (Signed)
Andrea Bailey is an 46 y.o. female.   Chief Complaint: Right leg pain HPI: The patient is a 46 year old female with right lower extremity pain. She was tried on conservative therapy without improvement. An MRI scan of the lumbar spine was obtained showed a disc herniation L5-S1 on the right. After discussing the options she requested surgical intervention. I had a long discussion with her the risks and benefits of surgical intervention. The risks discussed include but are not limited to bleeding infection weakness numbness paralysis spinal fluid leak comatose and death. We have discussed alternative methods of therapy of the risks and benefits of nonintervention. She's had the opportunity to ask numerous questions and appears to understand. With this information in hand she has requested to proceed with surgery.  Past Medical History  Diagnosis Date  . Hemorrhoids   . Hematoma 1994    after birth of second child  . Back pain     buldging pain    Past Surgical History  Procedure Date  . Neck surgery 2011  . Endometrial ablation several yrs ago  . Colonoscopy 09/05/2011    Procedure: COLONOSCOPY;  Surgeon: Arlyce Harman, MD;  Location: [facility];  Service: Endoscopy;  Laterality: N/A;  11:30  . Cholecystectomy several yrs ago  . Cesarean section 1998    along with tubal ligation    Family History  Problem Relation Age of Onset  . Colon cancer Neg Hx   . Anesthesia problems Neg Hx   . Hypotension Neg Hx   . Malignant hyperthermia Neg Hx   . Pseudochol deficiency Neg Hx    Social History:  reports that she has been smoking.  She does not have any smokeless tobacco history on file. She reports that she drinks alcohol. She reports that she does not use illicit drugs.  Allergies: No Known Allergies  Medications Prior to Admission  Medication Dose Route Frequency Provider Last Rate Last Dose  . dexamethasone (DECADRON) 10 MG/ML injection           . dexamethasone (DECADRON)  injection 10 mg  10 mg Intravenous Once Reinaldo Meeker, MD      . diphenhydrAMINE (BENADRYL) 50 MG/ML injection           . diphenhydrAMINE (BENADRYL) injection 50 mg  50 mg Intravenous Once Reinaldo Meeker, MD      . tobramycin (NEBCIN) 80 mg in dextrose 5 % 50 mL IVPB  80 mg Intravenous To 282 Reinaldo Meeker, MD      . vancomycin (VANCOCIN) 500 mg in sodium chloride 0.9 % 100 mL IVPB  500 mg Intravenous To 282 Reinaldo Meeker, MD       Medications Prior to Admission  Medication Sig Dispense Refill  . JINTELI 1-5 MG-MCG TABS Take 1 tablet by mouth daily.       . valACYclovir (VALTREX) 1000 MG tablet Take 1,000 mg by mouth daily.         Results for orders placed during the hospital encounter of 12/27/11 (from the past 48 hour(s))  SURGICAL PCR SCREEN     Status: Normal   Collection Time   12/27/11  9:32 AM      Component Value Range Comment   MRSA, PCR NEGATIVE  NEGATIVE     Staphylococcus aureus NEGATIVE  NEGATIVE    CBC     Status: Normal   Collection Time   12/27/11  9:36 AM      Component Value Range Comment  WBC 10.5  4.0 - 10.5 (K/uL)    RBC 4.23  3.87 - 5.11 (MIL/uL)    Hemoglobin 14.1  12.0 - 15.0 (g/dL)    HCT 16.1  09.6 - 04.5 (%)    MCV 95.7  78.0 - 100.0 (fL)    MCH 33.3  26.0 - 34.0 (pg)    MCHC 34.8  30.0 - 36.0 (g/dL)    RDW 40.9  81.1 - 91.4 (%)    Platelets 183  150 - 400 (K/uL)    No results found.  Pertinent items are noted in HPI.  Blood pressure 129/81, pulse 71, temperature 97.3 F (36.3 C), temperature source Oral, resp. rate 18, SpO2 99.00%.  Her strength is intact she has a decreased ankle reflex. Assessment/Plan Impression is that of a herniated disc L5-S1 on the right and the plan is for a lumbar microdiscectomy.  Reinaldo Meeker, MD 12/28/2011, 4:15 PM

## 2011-12-28 NOTE — Brief Op Note (Signed)
12/28/2011  6:28 PM  PATIENT:  Andrea Bailey  46 y.o. female  PRE-OPERATIVE DIAGNOSIS:  RIGHT  LUMBAR FIVE SACRAL ONE MICRODISKECTOMY  POST-OPERATIVE DIAGNOSIS:  RIGHT  LUMBAR FIVE SACRAL ONE MICRODISKECTOMY  PROCEDURE:  Procedure(s): LUMBAR LAMINECTOMY/DECOMPRESSION MICRODISCECTOMY  SURGEON:  Surgeon(s): Reinaldo Meeker, MD Karn Cassis, MD  PHYSICIAN ASSISTANT:   ASSISTANTS: Botero   ANESTHESIA:   general  EBL:  Total I/O In: 1500 [I.V.:1500] Out: 50 [Blood:50]  BLOOD ADMINISTERED:none  DRAINS: none   LOCAL MEDICATIONS USED:  MARCAINE 20CC  SPECIMEN:  No Specimen  DISPOSITION OF SPECIMEN:  N/A  COUNTS:  YES  TOURNIQUET:  * No tourniquets in log *  DICTATION: .Dragon Dictation  PLAN OF CARE: Admit for overnight observation  PATIENT DISPOSITION:  PACU - hemodynamically stable.   Delay start of Pharmacological VTE agent (>24hrs) due to surgical blood loss or risk of bleeding:  {YES/NO/NOT APPLICABLE:20182

## 2011-12-28 NOTE — Preoperative (Signed)
Beta Blockers   Reason not to administer Beta Blockers:Not Applicable 

## 2011-12-28 NOTE — Transfer of Care (Signed)
Immediate Anesthesia Transfer of Care Note  Patient: Andrea Bailey  Procedure(s) Performed:  LUMBAR LAMINECTOMY/DECOMPRESSION MICRODISCECTOMY - LUMBAR LAMINECTOMY DECOMPRESSION MICRODISCECTOMY LUMBAR 5-SACRAL ONE  Patient Location: PACU  Anesthesia Type: General  Level of Consciousness: awake, alert  and oriented  Airway & Oxygen Therapy: Patient Spontanous Breathing and Patient connected to nasal cannula oxygen  Post-op Assessment: Report given to PACU RN  Post vital signs: Reviewed and stable  Complications: No apparent anesthesia complications

## 2011-12-29 MED ORDER — HYDROCODONE-ACETAMINOPHEN 5-325 MG PO TABS: 1.0000 | ORAL_TABLET | ORAL | Status: AC | PRN

## 2011-12-29 NOTE — Discharge Summary (Signed)
  Physician Discharge Summary  Patient ID: Andrea Bailey MRN: [id number] DOB/AGE: [birth year] 46 y.o.  Admit date: 12/28/2011 Discharge date: 12/29/2011  Admission Diagnoses:  Discharge Diagnoses:  Active Problems:  * No active hospital problems. *    Discharged Condition: good  Hospital Course: Surgery one day, home the next. Pain much better. Wound healing well.  Consults: None  Significant Diagnostic Studies: none  Treatments: Right L5 S1 microdiskectomy  Discharge Exam: Blood pressure 102/69, pulse 68, temperature 98.2 F (36.8 C), temperature source Oral, resp. rate 16, SpO2 96.00%. Incision/Wound:healing well  Disposition: Home or Self Care  Discharge Orders    Future Orders Please Complete By Expires   Diet general      Discharge instructions      Comments:   Mostly bedrest. Get up 9 or 10 times each day and walk for 15-20 minutes each time. Very little sitting the first week. No riding in the car until your first post op appointment. If you had neck surgery...may shower from the chest down. If you had low back surgery....you may shower with a saran wrap covering over the incision. Take your pain medicine as needed...and other medicines that you are instructed to take. Call for an appointment...[phone].   Call MD for:  temperature >100.4      Call MD for:  persistant nausea and vomiting      Call MD for:  severe uncontrolled pain      Call MD for:  redness, tenderness, or signs of infection (pain, swelling, redness, odor or green/yellow discharge around incision site)      Call MD for:  difficulty breathing, headache or visual disturbances      Call MD for:  hives        Medication List  As of 12/29/2011 10:50 AM   TAKE these medications         HYDROcodone-acetaminophen 5-325 MG per tablet   Commonly known as: NORCO   Take 1-2 tablets by mouth every 4 (four) hours as needed.      ibuprofen 200 MG tablet   Commonly known as: ADVIL,MOTRIN   Take 400 mg by mouth  every 6 (six) hours as needed. For pain      JINTELI 1-5 MG-MCG Tabs   Generic drug: norethindrone-ethinyl estradiol   Take 1 tablet by mouth daily.      valACYclovir 1000 MG tablet   Commonly known as: VALTREX   Take 1,000 mg by mouth daily.             At home rest most of the time. Get up 9 or 10 times each day and take a 15 or 20 minute walk. No riding in the car and to your first postoperative appointment. If you have neck surgery you may shower from the chest down starting on the third postoperative day. If you had back surgery he may start showering on the third postoperative day with saran wrap wrapped around your incisional area 3 times. After the shower remove the saran wrap. Take pain medicine as needed and other medications as instructed. Call my office for an appointment.  SignedReinaldo Meeker, MD 12/29/2011, 10:50 AM

## 2011-12-31 ENCOUNTER — Encounter (HOSPITAL_COMMUNITY): Payer: Self-pay | Admitting: Neurosurgery

## 2012-02-14 ENCOUNTER — Other Ambulatory Visit: Payer: Self-pay | Admitting: Neurosurgery

## 2012-02-14 DIAGNOSIS — R609 Edema, unspecified: Secondary | ICD-10-CM

## 2012-02-15 ENCOUNTER — Ambulatory Visit
Admission: RE | Admit: 2012-02-15 | Discharge: 2012-02-15 | Disposition: A | Discharge status: Home / Self Care | Payer: Managed Care, Other (non HMO) | Source: Ambulatory Visit | Attending: Neurosurgery | Admitting: Neurosurgery

## 2012-02-15 DIAGNOSIS — R609 Edema, unspecified: Secondary | ICD-10-CM

## 2012-05-13 ENCOUNTER — Other Ambulatory Visit: Payer: Self-pay | Admitting: Obstetrics and Gynecology

## 2012-06-09 ENCOUNTER — Encounter (HOSPITAL_COMMUNITY)
Admission: RE | Admit: 2012-06-09 | Discharge: 2012-06-09 | Disposition: A | Discharge status: Home / Self Care | Payer: Managed Care, Other (non HMO) | Source: Ambulatory Visit | Attending: Neurosurgery | Admitting: Neurosurgery

## 2012-06-09 ENCOUNTER — Encounter (HOSPITAL_COMMUNITY): Payer: Self-pay

## 2012-06-09 LAB — BASIC METABOLIC PANEL
BUN: 13 mg/dL (ref 6–23)
Calcium: 9.6 mg/dL (ref 8.4–10.5)
Chloride: 106 mEq/L (ref 96–112)
Creatinine, Ser: 0.68 mg/dL (ref 0.50–1.10)
GFR calc Af Amer: >90 mL/min (ref >90)

## 2012-06-09 LAB — CBC
HCT: 41.2 % (ref 36.0–46.0)
MCH: 33.5 pg (ref 26.0–34.0)
MCHC: 34.5 g/dL (ref 30.0–36.0)
MCV: 97.2 fL (ref 78.0–100.0)
RDW: 13.2 % (ref 11.5–15.5)
WBC: 9.1 10*3/uL (ref 4.0–10.5)

## 2012-06-09 NOTE — Progress Notes (Signed)
PATIENT HAS CYST THAT OPENED UP LT THIGH AREA, PATIENT ABLE TO CONTAIN WITH BANDAID.

## 2012-06-09 NOTE — Pre-Procedure Instructions (Signed)
20 Andrea Bailey  06/09/2012   Your procedure is scheduled on:   Thursday  06/12/12   Report to [facility] at 530 AM.  Call this number if you have problems the morning of surgery: [phone]   Remember:   Do not eat food OR DRINK :After Midnight.    Take these medicines the morning of surgery with A SIP OF WATER:  JINTELI, VALTREX   Do not wear jewelry, make-up or nail polish.  Do not wear lotions, powders, or perfumes. You may wear deodorant.  Do not shave 48 hours prior to surgery. Men may shave face and neck.  Do not bring valuables to the hospital.  Contacts, dentures or bridgework may not be worn into surgery.  Leave suitcase in the car. After surgery it may be brought to your room.  For patients admitted to the hospital, checkout time is 11:00 AM the day of discharge.   Patients discharged the day of surgery will not be allowed to drive home.  Name and phone number of your driver:   Special Instructions: CHG Shower Use Special Wash: 1/2 bottle night before surgery and 1/2 bottle morning of surgery.   Please read over the following fact sheets that you were given: Pain Booklet, Coughing and Deep Breathing, Blood Transfusion Information, MRSA Information and Surgical Site Infection Prevention

## 2012-06-10 LAB — ABO/RH: ABO/RH(D): A POS

## 2012-06-10 NOTE — Progress Notes (Signed)
Notified Vanessa at Dr.Kritzer's office of no orders in epic.

## 2012-06-11 ENCOUNTER — Other Ambulatory Visit: Payer: Self-pay | Admitting: Neurosurgery

## 2012-06-12 ENCOUNTER — Ambulatory Visit (HOSPITAL_COMMUNITY): Payer: Managed Care, Other (non HMO)

## 2012-06-12 ENCOUNTER — Inpatient Hospital Stay (HOSPITAL_COMMUNITY)
Admission: RE | Admit: 2012-06-12 | Discharge: 2012-06-13 | DRG: 460 | Disposition: A | Discharge status: Home / Self Care | Payer: Managed Care, Other (non HMO) | Source: Ambulatory Visit | Attending: Neurosurgery | Admitting: Neurosurgery

## 2012-06-12 ENCOUNTER — Encounter (HOSPITAL_COMMUNITY): Payer: Self-pay | Admitting: *Deleted

## 2012-06-12 ENCOUNTER — Encounter (HOSPITAL_COMMUNITY): Payer: Self-pay | Admitting: Certified Registered Nurse Anesthetist

## 2012-06-12 ENCOUNTER — Ambulatory Visit (HOSPITAL_COMMUNITY): Payer: Managed Care, Other (non HMO) | Admitting: Certified Registered Nurse Anesthetist

## 2012-06-12 ENCOUNTER — Encounter (HOSPITAL_COMMUNITY)
Admission: RE | Disposition: A | Discharge status: Home / Self Care | Payer: Self-pay | Source: Ambulatory Visit | Attending: Neurosurgery

## 2012-06-12 DIAGNOSIS — Z01812 Encounter for preprocedural laboratory examination: Secondary | ICD-10-CM

## 2012-06-12 DIAGNOSIS — M5126 Other intervertebral disc displacement, lumbar region: Secondary | ICD-10-CM

## 2012-06-12 SURGERY — POSTERIOR LUMBAR FUSION 1 LEVEL
Anesthesia: General | Site: Back | Wound class: Clean

## 2012-06-12 MED ORDER — DEXAMETHASONE SODIUM PHOSPHATE 4 MG/ML IJ SOLN
INTRAMUSCULAR | Status: DC | PRN
  Administered 2012-06-12: 8 mg via INTRAVENOUS

## 2012-06-12 MED ORDER — DEXAMETHASONE SODIUM PHOSPHATE 4 MG/ML IJ SOLN: 4.0000 mg | Freq: Four times a day (QID) | INTRAMUSCULAR | Status: AC

## 2012-06-12 MED ORDER — ARTIFICIAL TEARS OP OINT
TOPICAL_OINTMENT | OPHTHALMIC | Status: DC | PRN
  Administered 2012-06-12: 1 via OPHTHALMIC

## 2012-06-12 MED ORDER — VECURONIUM BROMIDE 10 MG IV SOLR
INTRAVENOUS | Status: DC | PRN
  Administered 2012-06-12: 2 mg via INTRAVENOUS

## 2012-06-12 MED ORDER — SODIUM CHLORIDE 0.9 % IV SOLN: 250.0000 mL | INTRAVENOUS | Status: DC

## 2012-06-12 MED ORDER — KCL IN DEXTROSE-NACL 20-5-0.45 MEQ/L-%-% IV SOLN
80.0000 mL/h | INTRAVENOUS | Status: DC
  Administered 2012-06-12: 80 mL/h via INTRAVENOUS
  Filled 2012-06-12 (×5): qty 1000

## 2012-06-12 MED ORDER — GLYCOPYRROLATE 0.2 MG/ML IJ SOLN
INTRAMUSCULAR | Status: DC | PRN
  Administered 2012-06-12: .4 mg via INTRAVENOUS

## 2012-06-12 MED ORDER — LACTATED RINGERS IV SOLN
INTRAVENOUS | Status: DC | PRN
  Administered 2012-06-12 (×2): via INTRAVENOUS

## 2012-06-12 MED ORDER — CEFAZOLIN SODIUM-DEXTROSE 2-3 GM-% IV SOLR
INTRAVENOUS | Status: AC
  Administered 2012-06-12: 2 g via INTRAVENOUS
  Filled 2012-06-12: qty 50

## 2012-06-12 MED ORDER — ACETAMINOPHEN 650 MG RE SUPP: 650.0000 mg | RECTAL | Status: DC | PRN

## 2012-06-12 MED ORDER — DIAZEPAM 5 MG PO TABS
5.0000 mg | ORAL_TABLET | Freq: Four times a day (QID) | ORAL | Status: DC | PRN
  Administered 2012-06-12 – 2012-06-13 (×2): 5 mg via ORAL
  Filled 2012-06-12 (×2): qty 1

## 2012-06-12 MED ORDER — NORETHINDRONE-ETH ESTRADIOL 1-5 MG-MCG PO TABS
1.0000 | ORAL_TABLET | Freq: Every day | ORAL | Status: DC
  Administered 2012-06-12 – 2012-06-13 (×2): 1 via ORAL
  Filled 2012-06-12: qty 1

## 2012-06-12 MED ORDER — LIDOCAINE HCL 4 % MT SOLN
OROMUCOSAL | Status: DC | PRN
  Administered 2012-06-12: 4 mL via TOPICAL

## 2012-06-12 MED ORDER — ONDANSETRON HCL 4 MG/2ML IJ SOLN: 4.0000 mg | Freq: Once | INTRAMUSCULAR | Status: DC | PRN

## 2012-06-12 MED ORDER — LIDOCAINE HCL (CARDIAC) 20 MG/ML IV SOLN
INTRAVENOUS | Status: DC | PRN
  Administered 2012-06-12: 100 mg via INTRAVENOUS

## 2012-06-12 MED ORDER — CEFAZOLIN SODIUM 1-5 GM-% IV SOLN
1.0000 g | Freq: Three times a day (TID) | INTRAVENOUS | Status: AC
  Administered 2012-06-12 (×2): 1 g via INTRAVENOUS
  Filled 2012-06-12 (×2): qty 50

## 2012-06-12 MED ORDER — ONDANSETRON HCL 4 MG/2ML IJ SOLN: 4.0000 mg | INTRAMUSCULAR | Status: DC | PRN

## 2012-06-12 MED ORDER — MIDAZOLAM HCL 5 MG/5ML IJ SOLN
INTRAMUSCULAR | Status: DC | PRN
  Administered 2012-06-12: 2 mg via INTRAVENOUS

## 2012-06-12 MED ORDER — HYDROMORPHONE HCL PF 1 MG/ML IJ SOLN: 0.2500 mg | INTRAMUSCULAR | Status: DC | PRN

## 2012-06-12 MED ORDER — ZOLPIDEM TARTRATE 5 MG PO TABS: 5.0000 mg | ORAL_TABLET | Freq: Every evening | ORAL | Status: DC | PRN

## 2012-06-12 MED ORDER — BACITRACIN 50000 UNITS IM SOLR
INTRAMUSCULAR | Status: AC
  Filled 2012-06-12: qty 1

## 2012-06-12 MED ORDER — BUPIVACAINE HCL (PF) 0.5 % IJ SOLN
INTRAMUSCULAR | Status: DC | PRN
  Administered 2012-06-12: 20 mL

## 2012-06-12 MED ORDER — HYDROMORPHONE HCL PF 1 MG/ML IJ SOLN: 1.0000 mg | INTRAMUSCULAR | Status: DC | PRN

## 2012-06-12 MED ORDER — SODIUM CHLORIDE 0.9 % IJ SOLN
3.0000 mL | Freq: Two times a day (BID) | INTRAMUSCULAR | Status: DC
  Administered 2012-06-12 – 2012-06-13 (×3): 3 mL via INTRAVENOUS

## 2012-06-12 MED ORDER — PHENOL 1.4 % MT LIQD: 1.0000 | OROMUCOSAL | Status: DC | PRN

## 2012-06-12 MED ORDER — OXYCODONE-ACETAMINOPHEN 5-325 MG PO TABS
1.0000 | ORAL_TABLET | ORAL | Status: DC | PRN
  Administered 2012-06-12 – 2012-06-13 (×3): 2 via ORAL
  Filled 2012-06-12 (×3): qty 2

## 2012-06-12 MED ORDER — NEOSTIGMINE METHYLSULFATE 1 MG/ML IJ SOLN
INTRAMUSCULAR | Status: DC | PRN
  Administered 2012-06-12: 3 mg via INTRAVENOUS

## 2012-06-12 MED ORDER — THROMBIN 20000 UNITS EX KIT
PACK | CUTANEOUS | Status: DC | PRN
  Administered 2012-06-12: 09:00:00 via TOPICAL

## 2012-06-12 MED ORDER — MENTHOL 3 MG MT LOZG: 1.0000 | LOZENGE | OROMUCOSAL | Status: DC | PRN

## 2012-06-12 MED ORDER — VALACYCLOVIR HCL 500 MG PO TABS
1000.0000 mg | ORAL_TABLET | Freq: Every day | ORAL | Status: DC
  Administered 2012-06-12: 1000 mg via ORAL
  Filled 2012-06-12 (×3): qty 2

## 2012-06-12 MED ORDER — SODIUM CHLORIDE 0.9 % IV SOLN
INTRAVENOUS | Status: AC
  Filled 2012-06-12: qty 500

## 2012-06-12 MED ORDER — PROPOFOL 10 MG/ML IV BOLUS
INTRAVENOUS | Status: DC | PRN
  Administered 2012-06-12: 150 mg via INTRAVENOUS

## 2012-06-12 MED ORDER — SODIUM CHLORIDE 0.9 % IJ SOLN: 3.0000 mL | INTRAMUSCULAR | Status: DC | PRN

## 2012-06-12 MED ORDER — NORETHINDRONE-ETH ESTRADIOL 1-5 MG-MCG PO TABS: 1.0000 | ORAL_TABLET | Freq: Every day | ORAL | Status: DC

## 2012-06-12 MED ORDER — HYDROMORPHONE HCL PF 1 MG/ML IJ SOLN
INTRAMUSCULAR | Status: AC
  Filled 2012-06-12: qty 1

## 2012-06-12 MED ORDER — FENTANYL CITRATE 0.05 MG/ML IJ SOLN
INTRAMUSCULAR | Status: DC | PRN
  Administered 2012-06-12: 50 ug via INTRAVENOUS
  Administered 2012-06-12: 100 ug via INTRAVENOUS
  Administered 2012-06-12 (×2): 50 ug via INTRAVENOUS
  Administered 2012-06-12 (×2): 100 ug via INTRAVENOUS

## 2012-06-12 MED ORDER — 0.9 % SODIUM CHLORIDE (POUR BTL) OPTIME
TOPICAL | Status: DC | PRN
  Administered 2012-06-12: 1000 mL

## 2012-06-12 MED ORDER — ACETAMINOPHEN 325 MG PO TABS: 650.0000 mg | ORAL_TABLET | ORAL | Status: DC | PRN

## 2012-06-12 MED ORDER — HYDROMORPHONE HCL PF 1 MG/ML IJ SOLN
0.2500 mg | INTRAMUSCULAR | Status: DC | PRN
  Administered 2012-06-12 (×2): 0.5 mg via INTRAVENOUS

## 2012-06-12 MED ORDER — ROCURONIUM BROMIDE 100 MG/10ML IV SOLN
INTRAVENOUS | Status: DC | PRN
  Administered 2012-06-12: 50 mg via INTRAVENOUS

## 2012-06-12 MED ORDER — SODIUM CHLORIDE 0.9 % IR SOLN
Status: DC | PRN
  Administered 2012-06-12: 09:00:00

## 2012-06-12 MED ORDER — DEXAMETHASONE 4 MG PO TABS
4.0000 mg | ORAL_TABLET | Freq: Four times a day (QID) | ORAL | Status: AC
  Administered 2012-06-12 (×2): 4 mg via ORAL
  Filled 2012-06-12 (×2): qty 1

## 2012-06-12 MED ORDER — ONDANSETRON HCL 4 MG/2ML IJ SOLN
INTRAMUSCULAR | Status: DC | PRN
  Administered 2012-06-12 (×2): 4 mg via INTRAVENOUS

## 2012-06-12 SURGICAL SUPPLY — 62 items
ADH SKN CLS APL DERMABOND .7 (GAUZE/BANDAGES/DRESSINGS) ×2
APL SKNCLS STERI-STRIP NONHPOA (GAUZE/BANDAGES/DRESSINGS) ×1
BAG DECANTER FOR FLEXI CONT (MISCELLANEOUS) ×2 IMPLANT
BENZOIN TINCTURE PRP APPL 2/3 (GAUZE/BANDAGES/DRESSINGS) ×2 IMPLANT
BLADE SURG ROTATE 9660 (MISCELLANEOUS) IMPLANT
BONE EQUIVA 10CC (Bone Implant) ×2 IMPLANT
BRUSH SCRUB EZ PLAIN DRY (MISCELLANEOUS) ×2 IMPLANT
CANISTER SUCTION 2500CC (MISCELLANEOUS) ×2 IMPLANT
CLOTH BEACON ORANGE TIMEOUT ST (SAFETY) ×2 IMPLANT
CONT SPEC 4OZ CLIKSEAL STRL BL (MISCELLANEOUS) ×4 IMPLANT
COVER BACK TABLE 24X17X13 BIG (DRAPES) IMPLANT
COVER TABLE BACK 60X90 (DRAPES) ×2 IMPLANT
DERMABOND ADVANCED (GAUZE/BANDAGES/DRESSINGS) ×2
DERMABOND ADVANCED .7 DNX12 (GAUZE/BANDAGES/DRESSINGS) ×2 IMPLANT
DRAPE C-ARM 42X72 X-RAY (DRAPES) ×4 IMPLANT
DRAPE LAPAROTOMY 100X72X124 (DRAPES) ×2 IMPLANT
DRAPE SURG 17X23 STRL (DRAPES) ×4 IMPLANT
DRESSING TELFA 8X3 (GAUZE/BANDAGES/DRESSINGS) ×2 IMPLANT
ELECT REM PT RETURN 9FT ADLT (ELECTROSURGICAL) ×2
ELECTRODE REM PT RTRN 9FT ADLT (ELECTROSURGICAL) ×1 IMPLANT
EVACUATOR 1/8 PVC DRAIN (DRAIN) ×2 IMPLANT
GAUZE SPONGE 4X4 16PLY XRAY LF (GAUZE/BANDAGES/DRESSINGS) ×2 IMPLANT
GLOVE BIO SURGEON STRL SZ8 (GLOVE) ×2 IMPLANT
GLOVE BIOGEL PI IND STRL 7.0 (GLOVE) ×2 IMPLANT
GLOVE BIOGEL PI IND STRL 8.5 (GLOVE) ×1 IMPLANT
GLOVE BIOGEL PI INDICATOR 7.0 (GLOVE) ×2
GLOVE BIOGEL PI INDICATOR 8.5 (GLOVE) ×1
GLOVE ECLIPSE 7.5 STRL STRAW (GLOVE) ×4 IMPLANT
GLOVE EXAM NITRILE MD LF STRL (GLOVE) ×4 IMPLANT
GLOVE SS BIOGEL STRL SZ 6.5 (GLOVE) ×3 IMPLANT
GLOVE SUPERSENSE BIOGEL SZ 6.5 (GLOVE) ×3
GOWN BRE IMP SLV AUR LG STRL (GOWN DISPOSABLE) ×2 IMPLANT
GOWN BRE IMP SLV AUR XL STRL (GOWN DISPOSABLE) ×6 IMPLANT
GOWN STRL REIN 2XL LVL4 (GOWN DISPOSABLE) IMPLANT
KIT BASIN OR (CUSTOM PROCEDURE TRAY) ×2 IMPLANT
KIT ROOM TURNOVER OR (KITS) ×2 IMPLANT
Lucent Straight Cage 10x22x9 ×4 IMPLANT
NEEDLE HYPO 22GX1.5 SAFETY (NEEDLE) ×2 IMPLANT
NS IRRIG 1000ML POUR BTL (IV SOLUTION) ×2 IMPLANT
PACK LAMINECTOMY NEURO (CUSTOM PROCEDURE TRAY) ×2 IMPLANT
PAD ARMBOARD 7.5X6 YLW CONV (MISCELLANEOUS) ×6 IMPLANT
PATTIES SURGICAL .75X.75 (GAUZE/BANDAGES/DRESSINGS) IMPLANT
ROD BENT 40MM (Rod) ×4 IMPLANT
SCREW MIN INVASIVE 6.5X35 (Screw) ×4 IMPLANT
SCREW POLYAXIA MIS 6.5X40MM (Screw) ×4 IMPLANT
SPONGE GAUZE 4X4 12PLY (GAUZE/BANDAGES/DRESSINGS) ×2 IMPLANT
SPONGE LAP 4X18 X RAY DECT (DISPOSABLE) IMPLANT
SPONGE SURGIFOAM ABS GEL 100 (HEMOSTASIS) ×2 IMPLANT
STRIP CLOSURE SKIN 1/2X4 (GAUZE/BANDAGES/DRESSINGS) ×2 IMPLANT
SUT VIC AB 0 CT1 18XCR BRD8 (SUTURE) ×1 IMPLANT
SUT VIC AB 0 CT1 8-18 (SUTURE) ×2
SUT VIC AB 2-0 OS6 18 (SUTURE) ×8 IMPLANT
SUT VIC AB 3-0 CP2 18 (SUTURE) ×2 IMPLANT
SYR 20ML ECCENTRIC (SYRINGE) ×2 IMPLANT
TOOL FLUTED BALL 7MM (MISCELLANEOUS) ×2 IMPLANT
TOOL MATCHSTK 3MM (MISCELLANEOUS) ×2 IMPLANT
TOP CLSR SEQUOIA (Orthopedic Implant) ×8 IMPLANT
TOWEL OR 17X24 6PK STRL BLUE (TOWEL DISPOSABLE) ×2 IMPLANT
TOWEL OR 17X26 10 PK STRL BLUE (TOWEL DISPOSABLE) ×2 IMPLANT
TRAP SPECIMEN MUCOUS 40CC (MISCELLANEOUS) ×2 IMPLANT
TRAY FOLEY CATH 14FRSI W/METER (CATHETERS) ×2 IMPLANT
WATER STERILE IRR 1000ML POUR (IV SOLUTION) ×2 IMPLANT

## 2012-06-12 NOTE — Anesthesia Preprocedure Evaluation (Signed)
Anesthesia Evaluation  Patient identified by MRN, date of birth, ID band Patient awake    Reviewed: Allergy & Precautions, H&P , NPO status , Patient's Chart, lab work & pertinent test results  Airway Mallampati: I TM Distance: >3 FB Neck ROM: Full    Dental  (+) Teeth Intact and Dental Advisory Given   Pulmonary  breath sounds clear to auscultation        Cardiovascular Rhythm:Regular Rate:Normal     Neuro/Psych    GI/Hepatic   Endo/Other    Renal/GU      Musculoskeletal   Abdominal   Peds  Hematology   Anesthesia Other Findings   Reproductive/Obstetrics                           Anesthesia Physical Anesthesia Plan  ASA: I  Anesthesia Plan: General   Post-op Pain Management:    Induction: Intravenous  Airway Management Planned: Oral ETT  Additional Equipment:   Intra-op Plan:   Post-operative Plan:   Informed Consent: I have reviewed the patients History and Physical, chart, labs and discussed the procedure including the risks, benefits and alternatives for the proposed anesthesia with the patient or authorized representative who has indicated his/her understanding and acceptance.   Dental advisory given  Plan Discussed with: CRNA, Anesthesiologist and Surgeon  Anesthesia Plan Comments:         Anesthesia Quick Evaluation  

## 2012-06-12 NOTE — Op Note (Signed)
Preop diagnosis: Herniated disc L5-S1 right, recurrent Postoperative diagnosis: Same Procedure: L5-S1 decompressive laminectomy for decompression of L5 and S1 nerve roots more so than needed for interbody fusion followed by bilateral L5-S1 microdiscectomy followed by L5-S1 posterior lumbar interbody fusion with peek interbody spacer followed by nonsegmental instrumentation L5-S1 with Pathfinder pedicle screw system followed by L5-S1 posterolateral fusion Surgeon: Shravan Salahuddin Assistant: Venetia Maxon  After and placed the prone position the patient's back was prepped and draped in the usual sterile fashion. Previous incision was opened and extended both the superior and inferior directions. This was carried on the spinous processes. Subperiosteal dissection was then carried out bilaterally on the spinous processes lamina facet joint to expose the L5 vertebra and sacrum and the dissection was carried out to the far lateral region to identify the transverse processes of L5 the far lateral aspect of the sacrum. Fluoroscopy showed approach the appropriate level. Using the Leksell rongeur spinous process of L5 and S1 removed. Starting the patient's left side generous laminotomy was performed removing the inferior 80% of the L5 lamina it three quarters of the facet joint and the superior one third of the S1 lamina. Residual bone and ligamentum flavum removed in a piecemeal fashion. Attention was then turned to the right side were similar decompression was carried out. On this side we aggressively decompressed the L5 and S1 nerve roots more so than needed for interbody fusion. We then entered the disc space and cleaned out the large herniated disc that was present at L5-S1 on the right. This gave excellent decompression the thecal sac and S1 nerve root as well as the L5 nerve root. We then cleaned out the disc on the left side. We then distracted the disc space up to a 9 mm size and interbody fusion with peek interbody spacers.  We used rotating cutters and then seen curettes to thoroughly cleaned out the disc space. We placed a peek interbody spacer a 9 mm size filled with a mixture of autologous bone and morselized allograft. Was impacted into good position without difficulty. We did the same thing on the opposite side and prior to placing the second spacer we placed a mixture of autologous bone morselized allograft it within the interspace to help with interbody fusion. Proximal we showed the spacers to be in good position. We then placed pedicle screws bilaterally at L5 and S1. We used high-speed drill for entry point and then passed the pedicle all from lateral to medial direction through the pedicle. We then tapped with a 5.5 mm tap and placed 6.5 mm x 40 mm screws at L5 bilaterally and 6.5 x 35 mm screws at S1. These were followed in excellent position AP lateral fluoroscopy. We then decorticated the far lateral region and placed a mixture of autologous bone morselized allograft for posterolateral fusion. We then irrigated copiously controlled any bleeding with upper coagulation and Gelfoam on the dura exposed area. We then chose appropriate length rods and secured them to the top of the screws with the top loading nuts. We did final tightening with torque and counter torque and compressed L5 on S1. Fossae showed good position of the interbody spacers and screws. We then left an epidural drain in the epidural space and brought out through a separate stab incision. We then closed the wound in multiple layers of Vicryl on the muscle fascia subcutaneous and subcuticular tissues. Dermabond and Steri-Strips were placed on the skin. A sterile dressing was applied and the patient was taken to recovery in  stable condition.

## 2012-06-12 NOTE — Progress Notes (Signed)
Paged MD regarding orders not being signed.

## 2012-06-12 NOTE — Transfer of Care (Signed)
Immediate Anesthesia Transfer of Care Note  Patient: Andrea Bailey  Procedure(s) Performed: Procedure(s) (LRB): POSTERIOR LUMBAR FUSION 1 LEVEL (N/A)  Patient Location: PACU  Anesthesia Type: General  Level of Consciousness: awake, alert  and oriented  Airway & Oxygen Therapy: Patient Spontanous Breathing and Patient connected to nasal cannula oxygen  Post-op Assessment: Report given to PACU RN, Post -op Vital signs reviewed and stable and Patient moving all extremities X 4  Post vital signs: Reviewed and stable  Complications: No apparent anesthesia complications

## 2012-06-12 NOTE — Addendum Note (Signed)
Addendum  created 06/12/12 1623 by Margaree Mackintosh, CRNA   Modules edited:Anesthesia Flowsheet

## 2012-06-12 NOTE — Anesthesia Procedure Notes (Signed)
Procedure Name: Intubation Performed by: Margaree Mackintosh Pre-anesthesia Checklist: Patient identified, Timeout performed, Emergency Drugs available, Suction available and Patient being monitored Patient Re-evaluated:Patient Re-evaluated prior to inductionOxygen Delivery Method: Circle system utilized Preoxygenation: Pre-oxygenation with 100% oxygen Intubation Type: IV induction Ventilation: Mask ventilation without difficulty Laryngoscope Size: Mac and 3 Grade View: Grade I Tube type: Oral Tube size: 7.5 mm Number of attempts: 1 Airway Equipment and Method: Stylet and LTA kit utilized Placement Confirmation: ETT inserted through vocal cords under direct vision,  positive ETCO2 and breath sounds checked- equal and bilateral Secured at: 21 cm Tube secured with: Tape Dental Injury: Teeth and Oropharynx as per pre-operative assessment

## 2012-06-12 NOTE — Progress Notes (Signed)
RN Notified of abnormal BP  

## 2012-06-12 NOTE — H&P (Signed)
  Andrea Bailey is an 46 y.o. female.   Chief Complaint: Right leg pain HPI: The patient is a 46 year old female who had a discectomy at L5-S1 last year. She initially did well but then developed recurrent right leg pain. She was tried on conservative therapy which gave her no improvement. An MRI scan showed a recurrent disc herniation L5-S1 on the right. After discussing the options the patient requested surgery now comes for posterior lumbar interbody fusion with pedicle screw fixation. I had a long discussion with her regarding the risks and benefits of surgical intervention. The risks discussed include but are not limited to bleeding infection weakness numbness paralysis spinal fluid leakage trouble with instrumentation nonunion coma and death. We have discussed alternative methods of therapy office and benefits of nonintervention. She has had the opportunity to ask numerous questions and appears to understand. With this information in hand she has requested we proceed with surgery.  Past Medical History  Diagnosis Date  . Hemorrhoids   . Hematoma 1994    after birth of second child  . Back pain     buldging pain    Past Surgical History  Procedure Date  . Neck surgery 2011  . Endometrial ablation several yrs ago  . Colonoscopy 09/05/2011    Procedure: COLONOSCOPY;  Surgeon: Arlyce Harman, MD;  Location: [facility];  Service: Endoscopy;  Laterality: N/A;  11:30  . Cholecystectomy several yrs ago  . Cesarean section 1998    along with tubal ligation  . Lumbar laminectomy/decompression microdiscectomy 12/28/2011    Procedure: LUMBAR LAMINECTOMY/DECOMPRESSION MICRODISCECTOMY;  Surgeon: Reinaldo Meeker, MD;  Location: [facility];  Service: Neurosurgery;  Laterality: Right;  LUMBAR LAMINECTOMY DECOMPRESSION MICRODISCECTOMY LUMBAR 5-SACRAL ONE    Family History  Problem Relation Age of Onset  . Colon cancer Neg Hx   . Anesthesia problems Neg Hx   . Hypotension Neg Hx   . Malignant  hyperthermia Neg Hx   . Pseudochol deficiency Neg Hx    Social History:  reports that she has been smoking.  She does not have any smokeless tobacco history on file. She reports that she drinks alcohol. She reports that she does not use illicit drugs.  Allergies: No Known Allergies  Medications Prior to Admission  Medication Sig Dispense Refill  . JINTELI 1-5 MG-MCG TABS Take 1 tablet by mouth daily.       . valACYclovir (VALTREX) 1000 MG tablet Take 1,000 mg by mouth daily.         No results found for this or any previous visit (from the past 48 hour(s)). No results found.  A comprehensive review of systems was negative.  Blood pressure 105/71, pulse 76, temperature 98.3 F (36.8 C), temperature source Oral, resp. rate 18, SpO2 97.00%.  The patient is awake or and oriented. Her strength is 5 over 5. Her sensation is decreased on the lateral aspect of the right foot. She is present straight leg raising on the right. Assessment/Plan Impression is that of recurrent herniated disc L5-S1 on the right and the plan is for interbody fusion with pedicle screw instrumentation.  Reinaldo Meeker, MD 06/12/2012, 7:31 AM

## 2012-06-12 NOTE — Anesthesia Postprocedure Evaluation (Signed)
  Anesthesia Post-op Note  Patient: Andrea Bailey  Procedure(s) Performed: Procedure(s) (LRB): POSTERIOR LUMBAR FUSION 1 LEVEL (N/A)  Patient Location: PACU  Anesthesia Type: General  Level of Consciousness: awake, alert  and oriented  Airway and Oxygen Therapy: Patient Spontanous Breathing and Patient connected to nasal cannula oxygen  Post-op Pain: mild  Post-op Assessment: Post-op Vital signs reviewed  Post-op Vital Signs: Reviewed  Complications: No apparent anesthesia complications

## 2012-06-12 NOTE — Preoperative (Signed)
Beta Blockers   Reason not to administer Beta Blockers:Not Applicable 

## 2012-06-13 MED ORDER — OXYCODONE-ACETAMINOPHEN 5-325 MG PO TABS: 1.0000 | ORAL_TABLET | ORAL | Status: AC | PRN

## 2012-06-13 MED ORDER — VALACYCLOVIR HCL 500 MG PO TABS
1000.0000 mg | ORAL_TABLET | Freq: Every day | ORAL | Status: DC
  Administered 2012-06-13: 1000 mg via ORAL

## 2012-06-13 NOTE — Progress Notes (Signed)
Pt dc instructions provided by RN watching pts and RN gave home meds back to pt. Pt iv dc, intact by RN kim. Pt under no s/s distress.

## 2012-06-13 NOTE — Discharge Summary (Signed)
  Physician Discharge Summary  Patient ID: Andrea Bailey MRN: [id number] DOB/AGE: 1965/12/25 46 y.o.  Admit date: 06/12/2012 Discharge date: 06/13/2012  Admission Diagnoses:  Discharge Diagnoses:  Active Problems:  * No active hospital problems. *    Discharged Condition: good  Hospital Course: Surgery one day with lumbar fusion, home the next. Did well with marked improvement in leg pain. Wound fine, ambulated well. Hoe POD 1, specific instructions given.  Consults: None  Significant Diagnostic Studies: none   Treatments: surgery: L5 S1 plif  Discharge Exam: Blood pressure 106/63, pulse 63, temperature 98.6 F (37 C), temperature source Oral, resp. rate 18, height 5\' 6"  (1.676 m), weight 65.318 kg (144 lb), SpO2 99.00%. Incision/Wound:healing well  Disposition: 01-Home or Self Care  Discharge Orders    Future Orders Please Complete By Expires   Diet general      Discharge instructions      Comments:   Mostly bedrest. Get up 9 or 10 times each day and walk for 15-20 minutes each time. Very little sitting the first week. No riding in the car until your first post op appointment. If you had neck surgery...may shower from the chest down. If you had low back surgery....you may shower with a saran wrap covering over the incision. Take your pain medicine as needed...and other medicines that you are instructed to take. Call for an appointment...[phone].   Call MD for:  temperature >100.4      Call MD for:  persistant nausea and vomiting      Call MD for:  severe uncontrolled pain      Call MD for:  redness, tenderness, or signs of infection (pain, swelling, redness, odor or green/yellow discharge around incision site)      Call MD for:  difficulty breathing, headache or visual disturbances      Call MD for:  hives        Medication List  As of 06/13/2012  2:40 PM   TAKE these medications         JINTELI 1-5 MG-MCG Tabs   Generic drug: norethindrone-ethinyl estradiol   Take 1  tablet by mouth daily.      oxyCODONE-acetaminophen 5-325 MG per tablet   Commonly known as: PERCOCET   Take 1-2 tablets by mouth every 4 (four) hours as needed.      valACYclovir 1000 MG tablet   Commonly known as: VALTREX   Take 1,000 mg by mouth daily.             At home rest most of the time. Get up 9 or 10 times each day and take a 15 or 20 minute walk. No riding in the car and to your first postoperative appointment. If you have neck surgery you may shower from the chest down starting on the third postoperative day. If you had back surgery he may start showering on the third postoperative day with saran wrap wrapped around your incisional area 3 times. After the shower remove the saran wrap. Take pain medicine as needed and other medications as instructed. Call my office for an appointment.  Signed: Reinaldo Meeker, MD 06/13/2012, 2:40 PM

## 2012-06-13 NOTE — Progress Notes (Signed)
Pt home meds given later due to waiting for pharmacy to send them up.

## 2012-08-26 ENCOUNTER — Other Ambulatory Visit (HOSPITAL_COMMUNITY)
Admission: RE | Admit: 2012-08-26 | Discharge: 2012-08-26 | Disposition: A | Discharge status: Home / Self Care | Payer: Managed Care, Other (non HMO) | Source: Ambulatory Visit | Attending: Obstetrics and Gynecology | Admitting: Obstetrics and Gynecology

## 2012-08-26 ENCOUNTER — Other Ambulatory Visit: Payer: Self-pay | Admitting: Adult Health

## 2012-08-26 DIAGNOSIS — Z01419 Encounter for gynecological examination (general) (routine) without abnormal findings: Secondary | ICD-10-CM | POA: Insufficient documentation

## 2012-08-26 DIAGNOSIS — Z1151 Encounter for screening for human papillomavirus (HPV): Secondary | ICD-10-CM | POA: Insufficient documentation

## 2012-09-05 ENCOUNTER — Encounter (HOSPITAL_COMMUNITY): Payer: Self-pay | Admitting: Pharmacy Technician

## 2012-09-08 ENCOUNTER — Other Ambulatory Visit: Payer: Self-pay | Admitting: Obstetrics and Gynecology

## 2012-09-08 ENCOUNTER — Encounter (HOSPITAL_COMMUNITY): Payer: Self-pay

## 2012-09-08 ENCOUNTER — Encounter (HOSPITAL_COMMUNITY)
Admission: RE | Admit: 2012-09-08 | Discharge: 2012-09-08 | Disposition: A | Discharge status: Home / Self Care | Payer: Managed Care, Other (non HMO) | Source: Ambulatory Visit | Attending: Obstetrics and Gynecology | Admitting: Obstetrics and Gynecology

## 2012-09-08 LAB — CBC
HCT: 43.5 % (ref 36.0–46.0)
RBC: 4.45 MIL/uL (ref 3.87–5.11)
RDW: 13.9 % (ref 11.5–15.5)
WBC: 8.8 10*3/uL (ref 4.0–10.5)

## 2012-09-08 LAB — URINALYSIS, ROUTINE W REFLEX MICROSCOPIC
Glucose, UA: NEGATIVE mg/dL
Leukocytes, UA: NEGATIVE
Protein, ur: NEGATIVE mg/dL
Urobilinogen, UA: 0.2 mg/dL (ref 0.0–1.0)

## 2012-09-08 LAB — SURGICAL PCR SCREEN: MRSA, PCR: NEGATIVE

## 2012-09-08 MED ORDER — DEXAMETHASONE SODIUM PHOSPHATE 10 MG/ML IJ SOLN: 10.0000 mg | INTRAMUSCULAR | Status: DC

## 2012-09-08 MED ORDER — CEFAZOLIN SODIUM 1-5 GM-% IV SOLN: 1.0000 g | INTRAVENOUS | Status: DC

## 2012-09-08 NOTE — Patient Instructions (Addendum)
20 CHINELO BENN  09/08/2012   Your procedure is scheduled on:  09/12/12  Report to [facility] at 06:15 AM.  Call this number if you have problems the morning of surgery: [phone]   Remember:   Do not eat food:After Midnight.  May have clear liquids:until Midnight .  Clear liquids include soda, tea, black coffee, apple or grape juice, broth.  Take these medicines the morning of surgery with A SIP OF WATER: None   Do not wear jewelry, make-up or nail polish.  Do not wear lotions, powders, or perfumes. You may wear deodorant.  Do not shave 48 hours prior to surgery. Men may shave face and neck.  Do not bring valuables to the hospital.  Contacts, dentures or bridgework may not be worn into surgery.  Leave suitcase in the car. After surgery it may be brought to your room.  For patients admitted to the hospital, checkout time is 11:00 AM the day of discharge.   Patients discharged the day of surgery will not be allowed to drive home.  Name and phone number of your driver:   Special Instructions: Shower using CHG 2 nights before surgery and the night before surgery.  If you shower the day of surgery use CHG.  Use special wash - you have one bottle of CHG for all showers.  You should use approximately 1/3 of the bottle for each shower.   Please read over the following fact sheets that you were given: Anesthesia Post-op Instructions and Care and Recovery After Surgery    Incision and Drainage of Abscess An abscess (boil or furuncle) is an area infected by germs that contains a collection of pus. Signs and problems (symptoms) of an abscess include pain, tenderness, redness, or hardness. You may feel a moveable, soft area under your skin. An abscess can occur anywhere in the body. Occasionally, this may spread to surrounding tissues causing cellulitis. Sometimes, a surgeon may make a cut (incision) over your abscess. The pus is drained. Gauze may be packed into the space to provide a drain. Keeping  a drain or piece of gauze in the incision keeps the skin from healing first. This helps stop the abscess from forming again. The area may be painful for 5 to 7 days. Most people with an abscess do not have high fevers. If seen early, your abscess may not have localized and may not be cut. If it does not get better on its own or with medicines, you may require another appointment. HOME CARE INSTRUCTIONS   Only take over-the-counter or prescription medicines for pain, discomfort, or fever as directed by your caregiver. Use these only if your caregiver has not given medicines that would interfere.   When you bathe, remove the gauze drain after soaking. You may then wash the wound gently with mild, soapy water. Repack with gauze as your caregiver directs.   See your caregiver as directed for a recheck.   If antibiotics were prescribed, take them as directed.  SEEK MEDICAL CARE IF:   You develop increased pain, swelling, redness, drainage, or bleeding in the wound site.   You develop signs of generalized infection, including muscle aches, chills, or a general ill feeling.   You or your child has an oral temperature above 102 F (38.9 C).  MAKE SURE YOU:   Understand these instructions.   Will watch your condition.   Will get help right away if you are not doing well or get worse.  Document Released:  05/15/2001 Document Revised: 08/01/2011 Document Reviewed: 07/09/2008 [facility] Patient Information 2012 [place], [place].   PATIENT INSTRUCTIONS POST-ANESTHESIA  IMMEDIATELY FOLLOWING SURGERY:  Do not drive or operate machinery for the first twenty four hours after surgery.  Do not make any important decisions for twenty four hours after surgery or while taking narcotic pain medications or sedatives.  If you develop intractable nausea and vomiting or a severe headache please notify your doctor immediately.  FOLLOW-UP:  Please make an appointment with your surgeon as instructed. You do not need  to follow up with anesthesia unless specifically instructed to do so.  WOUND CARE INSTRUCTIONS (if applicable):  Keep a dry clean dressing on the anesthesia/puncture wound site if there is drainage.  Once the wound has quit draining you may leave it open to air.  Generally you should leave the bandage intact for twenty four hours unless there is drainage.  If the epidural site drains for more than 36-48 hours please call the anesthesia department.  QUESTIONS?:  Please feel free to call your physician or the hospital operator if you have any questions, and they will be happy to assist you.

## 2012-09-12 ENCOUNTER — Encounter (HOSPITAL_COMMUNITY)
Admission: RE | Disposition: A | Discharge status: Home / Self Care | Payer: Self-pay | Source: Ambulatory Visit | Attending: Obstetrics and Gynecology

## 2012-09-12 ENCOUNTER — Ambulatory Visit (HOSPITAL_COMMUNITY)
Admission: RE | Admit: 2012-09-12 | Discharge: 2012-09-12 | Disposition: A | Discharge status: Home / Self Care | Payer: Managed Care, Other (non HMO) | Source: Ambulatory Visit | Attending: Obstetrics and Gynecology | Admitting: Obstetrics and Gynecology

## 2012-09-12 ENCOUNTER — Encounter (HOSPITAL_COMMUNITY): Payer: Self-pay | Admitting: Anesthesiology

## 2012-09-12 ENCOUNTER — Ambulatory Visit (HOSPITAL_COMMUNITY): Payer: Managed Care, Other (non HMO) | Admitting: Anesthesiology

## 2012-09-12 ENCOUNTER — Encounter (HOSPITAL_COMMUNITY): Payer: Self-pay | Admitting: *Deleted

## 2012-09-12 DIAGNOSIS — L02214 Cutaneous abscess of groin: Secondary | ICD-10-CM

## 2012-09-12 DIAGNOSIS — L02219 Cutaneous abscess of trunk, unspecified: Secondary | ICD-10-CM | POA: Insufficient documentation

## 2012-09-12 SURGERY — INCISION AND DRAINAGE, ABSCESS
Anesthesia: General | Site: Groin | Wound class: Contaminated

## 2012-09-12 MED ORDER — LACTATED RINGERS IV SOLN
INTRAVENOUS | Status: DC
  Administered 2012-09-12: 08:00:00 via INTRAVENOUS

## 2012-09-12 MED ORDER — CEFAZOLIN SODIUM-DEXTROSE 2-3 GM-% IV SOLR: 2.0000 g | INTRAVENOUS | Status: DC

## 2012-09-12 MED ORDER — FENTANYL CITRATE 0.05 MG/ML IJ SOLN
INTRAMUSCULAR | Status: AC
  Filled 2012-09-12: qty 2

## 2012-09-12 MED ORDER — MIDAZOLAM HCL 2 MG/2ML IJ SOLN
1.0000 mg | INTRAMUSCULAR | Status: DC | PRN
  Administered 2012-09-12 (×2): 2 mg via INTRAVENOUS

## 2012-09-12 MED ORDER — CIPROFLOXACIN HCL 500 MG PO TABS: 500.0000 mg | ORAL_TABLET | Freq: Two times a day (BID) | ORAL | Status: DC

## 2012-09-12 MED ORDER — FENTANYL CITRATE 0.05 MG/ML IJ SOLN
25.0000 ug | INTRAMUSCULAR | Status: DC | PRN
  Administered 2012-09-12: 25 ug via INTRAVENOUS

## 2012-09-12 MED ORDER — CEFAZOLIN SODIUM-DEXTROSE 2-3 GM-% IV SOLR
INTRAVENOUS | Status: AC
  Filled 2012-09-12: qty 50

## 2012-09-12 MED ORDER — BUPIVACAINE HCL (PF) 0.5 % IJ SOLN
INTRAMUSCULAR | Status: DC | PRN
  Administered 2012-09-12: 7 mL

## 2012-09-12 MED ORDER — LIDOCAINE-EPINEPHRINE (PF) 1 %-1:200000 IJ SOLN
INTRAMUSCULAR | Status: AC
  Filled 2012-09-12: qty 10

## 2012-09-12 MED ORDER — HYDROCODONE-ACETAMINOPHEN 5-500 MG PO TABS: 1.0000 | ORAL_TABLET | ORAL | Status: DC | PRN

## 2012-09-12 MED ORDER — BUPIVACAINE HCL (PF) 0.5 % IJ SOLN
INTRAMUSCULAR | Status: AC
  Filled 2012-09-12: qty 30

## 2012-09-12 MED ORDER — MIDAZOLAM HCL 5 MG/5ML IJ SOLN
INTRAMUSCULAR | Status: DC | PRN
  Administered 2012-09-12: 2 mg via INTRAVENOUS

## 2012-09-12 MED ORDER — 0.9 % SODIUM CHLORIDE (POUR BTL) OPTIME
TOPICAL | Status: DC | PRN
  Administered 2012-09-12: 1000 mL

## 2012-09-12 MED ORDER — CEFAZOLIN SODIUM-DEXTROSE 2-3 GM-% IV SOLR
INTRAVENOUS | Status: DC | PRN
  Administered 2012-09-12: 2 g via INTRAVENOUS

## 2012-09-12 MED ORDER — MIDAZOLAM HCL 2 MG/2ML IJ SOLN
INTRAMUSCULAR | Status: AC
  Filled 2012-09-12: qty 2

## 2012-09-12 MED ORDER — PROPOFOL 10 MG/ML IV BOLUS
INTRAVENOUS | Status: DC | PRN
  Administered 2012-09-12: 160 mg via INTRAVENOUS

## 2012-09-12 MED ORDER — BUPIVACAINE-EPINEPHRINE PF 0.5-1:200000 % IJ SOLN
INTRAMUSCULAR | Status: AC
  Filled 2012-09-12: qty 10

## 2012-09-12 MED ORDER — LIDOCAINE HCL 1 % IJ SOLN
INTRAMUSCULAR | Status: DC | PRN
  Administered 2012-09-12: 50 mg via INTRADERMAL

## 2012-09-12 MED ORDER — ONDANSETRON HCL 4 MG/2ML IJ SOLN: 4.0000 mg | Freq: Once | INTRAMUSCULAR | Status: DC | PRN

## 2012-09-12 MED ORDER — FENTANYL CITRATE 0.05 MG/ML IJ SOLN
INTRAMUSCULAR | Status: DC | PRN
  Administered 2012-09-12: 50 ug via INTRAVENOUS
  Administered 2012-09-12 (×2): 25 ug via INTRAVENOUS

## 2012-09-12 SURGICAL SUPPLY — 31 items
APL SKNCLS STERI-STRIP NONHPOA (GAUZE/BANDAGES/DRESSINGS) ×1
BAG HAMPER (MISCELLANEOUS) ×2 IMPLANT
BENZOIN TINCTURE PRP APPL 2/3 (GAUZE/BANDAGES/DRESSINGS) ×2 IMPLANT
BLADE SURG 15 STRL LF DISP TIS (BLADE) ×1 IMPLANT
BLADE SURG 15 STRL SS (BLADE) ×1
CLOTH BEACON ORANGE TIMEOUT ST (SAFETY) ×2 IMPLANT
COVER LIGHT HANDLE STERIS (MISCELLANEOUS) ×4 IMPLANT
DECANTER SPIKE VIAL GLASS SM (MISCELLANEOUS) ×2 IMPLANT
ELECT REM PT RETURN 9FT ADLT (ELECTROSURGICAL) ×2
ELECTRODE REM PT RTRN 9FT ADLT (ELECTROSURGICAL) ×1 IMPLANT
GLOVE BIOGEL PI IND STRL 7.5 (GLOVE) ×1 IMPLANT
GLOVE BIOGEL PI INDICATOR 7.5 (GLOVE) ×1
GLOVE ECLIPSE 7.0 STRL STRAW (GLOVE) ×2 IMPLANT
GLOVE ECLIPSE 9.0 STRL (GLOVE) ×2 IMPLANT
GLOVE EXAM NITRILE MD LF STRL (GLOVE) ×2 IMPLANT
GLOVE INDICATOR STER SZ 9 (GLOVE) ×2 IMPLANT
GOWN STRL REIN 3XL LVL4 (GOWN DISPOSABLE) ×2 IMPLANT
GOWN STRL REIN XL XLG (GOWN DISPOSABLE) ×4 IMPLANT
KIT ROOM TURNOVER AP CYSTO (KITS) ×2 IMPLANT
MANIFOLD NEPTUNE II (INSTRUMENTS) ×2 IMPLANT
NEEDLE HYPO 25X1 1.5 SAFETY (NEEDLE) ×2 IMPLANT
NS IRRIG 1000ML POUR BTL (IV SOLUTION) ×2 IMPLANT
PACK PERI GYN (CUSTOM PROCEDURE TRAY) ×2 IMPLANT
PAD ARMBOARD 7.5X6 YLW CONV (MISCELLANEOUS) ×2 IMPLANT
SET BASIN LINEN APH (SET/KITS/TRAYS/PACK) ×2 IMPLANT
STRIP CLOSURE SKIN 1/2X4 (GAUZE/BANDAGES/DRESSINGS) ×2 IMPLANT
SUT CHROMIC 3 0 SH 27 (SUTURE) IMPLANT
SUT VIC AB 4-0 PS2 27 (SUTURE) ×2 IMPLANT
SWAB CULTURE LIQ STUART DBL (MISCELLANEOUS) ×2 IMPLANT
SYR CONTROL 10ML LL (SYRINGE) IMPLANT
TUBE ANAEROBIC PORT A CUL  W/M (MISCELLANEOUS) ×2 IMPLANT

## 2012-09-12 NOTE — Brief Op Note (Signed)
09/12/2012  8:37 AM  PATIENT:  Andrea Bailey  46 y.o. female  PRE-OPERATIVE DIAGNOSIS:  left inguinal abscess  POST-OPERATIVE DIAGNOSIS:  left inguinal chronic abscess  PROCEDURE:  Procedure(s) (LRB) with comments: INCISION AND DRAINAGE ABSCESS (N/A) - Excision abscess sinus tract   SURGEON:  Surgeon(s) and Role:    * Tilda Burrow, MD - Primary  PHYSICIAN ASSISTANT:   ASSISTANTS: [place]well CST   ANESTHESIA:   local and general  EBL:  Total I/O In: 500 [I.V.:500] Out: -   BLOOD ADMINISTERED:none  DRAINS: none   LOCAL MEDICATIONS USED:  MARCAINE    and Amount: 9 ml  SPECIMEN:  Source of Specimen:  Left inguinal sinus tract  DISPOSITION OF SPECIMEN:  PATHOLOGY  COUNTS:  YES  TOURNIQUET:  * No tourniquets in log *  DICTATION: .Dragon Dictation Patient was taken operating room prepped and draped for perineal surgery. The 3 cm long sinus tract from the left inguinal crease into the left inner thigh was identified. Surrounding tissues were infiltrated with Marcaine A linear incision was made over the sinus tract, and the skin opening cord out. The sinus tract itself was excised back to healthy fatty tissue on either side being careful to remain superficial.. No deep cautery was used. Fatty tissue on each side to be pulled into the middle of the small defect in the deep layer 4-0 Vicryl placed in a continuous running fashion. The skin itself was pulled together with subcuticular 4-0 Vicryl. Steri-Strips with benzoin were used for reinforcement and patient went to recovery room in good condition  PLAN OF CARE: Discharge to home after PACU  PATIENT DISPOSITION:  PACU - hemodynamically stable.   Delay start of Pharmacological VTE agent (>24hrs) due to surgical blood loss or risk of bleeding: not applicable

## 2012-09-12 NOTE — Transfer of Care (Signed)
Immediate Anesthesia Transfer of Care Note  Patient: Andrea Bailey  Procedure(s) Performed: Procedure(s) (LRB) with comments: INCISION AND DRAINAGE ABSCESS (N/A) - Excision abscess sinus tract   Patient Location: PACU  Anesthesia Type: General  Level of Consciousness: awake and patient cooperative  Airway & Oxygen Therapy: Patient Spontanous Breathing and Patient connected to face mask oxygen  Post-op Assessment: Report given to PACU RN, Post -op Vital signs reviewed and stable and Patient moving all extremities  Post vital signs: Reviewed and stable  Complications: No apparent anesthesia complications

## 2012-09-12 NOTE — Anesthesia Procedure Notes (Signed)
Procedure Name: LMA Insertion Date/Time: 09/12/2012 7:58 AM Performed by: Despina Hidden Pre-anesthesia Checklist: Patient identified, Patient being monitored, Emergency Drugs available and Suction available Patient Re-evaluated:Patient Re-evaluated prior to inductionOxygen Delivery Method: Circle system utilized Preoxygenation: Pre-oxygenation with 100% oxygen Intubation Type: IV induction Ventilation: Mask ventilation without difficulty LMA Size: 3.0 Tube type: Oral Number of attempts: 1 Tube secured with: Tape Dental Injury: Teeth and Oropharynx as per pre-operative assessment

## 2012-09-12 NOTE — Anesthesia Preprocedure Evaluation (Signed)
Anesthesia Evaluation  Patient identified by MRN, date of birth, ID band Patient awake    Reviewed: Allergy & Precautions, H&P , NPO status , Patient's Chart, lab work & pertinent test results  History of Anesthesia Complications Negative for: history of anesthetic complications  Airway Mallampati: II TM Distance: >3 FB     Dental  (+) Teeth Intact   Pulmonary Current Smoker,  breath sounds clear to auscultation        Cardiovascular negative cardio ROS  Rhythm:Regular     Neuro/Psych    GI/Hepatic negative GI ROS,   Endo/Other    Renal/GU      Musculoskeletal   Abdominal   Peds  Hematology   Anesthesia Other Findings R leg numbness  Reproductive/Obstetrics                           Anesthesia Physical Anesthesia Plan  ASA: I  Anesthesia Plan: General   Post-op Pain Management:    Induction: Intravenous  Airway Management Planned: LMA  Additional Equipment:   Intra-op Plan:   Post-operative Plan: Extubation in OR  Informed Consent: I have reviewed the patients History and Physical, chart, labs and discussed the procedure including the risks, benefits and alternatives for the proposed anesthesia with the patient or authorized representative who has indicated his/her understanding and acceptance.     Plan Discussed with:   Anesthesia Plan Comments:         Anesthesia Quick Evaluation

## 2012-09-12 NOTE — H&P (Signed)
Andrea Bailey is an 46 y.o. female.AShe is admitted for excision of a vulvar abscess that is too deep to remove in office, located at inguinal crease left leg. This is a chronic lesion   Pertinent Gynecological History: Menses: s/p ablation Bleeding: none Contraception: tubal ligation DES exposure: unknown Blood transfusions: none Sexually transmitted diseases: no past history Previous GYN Procedures: ablation  Last mammogram: normal Date:  Last pap: normal Date: 2012 OB History: G3, P3   Menstrual History: Menarche age:  No LMP recorded. Patient has had an ablation.    Past Medical History  Diagnosis Date  . Hemorrhoids   . Hematoma 1994    after birth of second child  . Back pain     buldging pain    Past Surgical History  Procedure Date  . Neck surgery 2011  . Endometrial ablation several yrs ago  . Colonoscopy 09/05/2011    Procedure: COLONOSCOPY;  Surgeon: Arlyce Harman, MD;  Location: [facility];  Service: Endoscopy;  Laterality: N/A;  11:30  . Cholecystectomy several yrs ago  . Cesarean section 1998    along with tubal ligation  . Lumbar laminectomy/decompression microdiscectomy 12/28/2011    Procedure: LUMBAR LAMINECTOMY/DECOMPRESSION MICRODISCECTOMY;  Surgeon: Reinaldo Meeker, MD;  Location: [facility];  Service: Neurosurgery;  Laterality: Right;  LUMBAR LAMINECTOMY DECOMPRESSION MICRODISCECTOMY LUMBAR 5-SACRAL ONE    Family History  Problem Relation Age of Onset  . Colon cancer Neg Hx   . Anesthesia problems Neg Hx   . Hypotension Neg Hx   . Malignant hyperthermia Neg Hx   . Pseudochol deficiency Neg Hx   . Lung cancer Mother   . Lung cancer Father   . Diabetes Daughter     Social History:  reports that she has been smoking Cigarettes.  She has a 5 pack-year smoking history. She does not have any smokeless tobacco history on file. She reports that she drinks alcohol. She reports that she does not use illicit drugs.  Allergies: No Known  Allergies  Prescriptions prior to admission  Medication Sig Dispense Refill  . JINTELI 1-5 MG-MCG TABS Take 1 tablet by mouth daily.       . valACYclovir (VALTREX) 1000 MG tablet Take 1,000 mg by mouth daily.         ROS  Blood pressure 107/69, pulse 68, temperature 98.5 F (36.9 C), temperature source Oral, resp. rate 22, SpO2 96.00%. Physical Exam Physical Examination: General appearance - alert, well appearing, and in no distress and oriented to person, place, and time Mental status - alert, oriented to person, place, and time Chest - clear to auscultation, no wheezes, rales or rhonchi, symmetric air entry Heart - normal rate and regular rhythm Abdomen - soft, nontender, nondistended, no masses or organomegaly Pelvic - VULVA: vulvar tenderness left inguinal crease, 3 cm lesion  No results found for this or any previous visit (from the past 24 hour(s)).  No results found.  Assessment/Plan: *left gluteal chronic abscess  For excision Vauda Salvucci V 09/12/2012, 7:38 AM

## 2012-09-12 NOTE — Op Note (Signed)
See operative note details included in brief of note

## 2012-09-12 NOTE — Anesthesia Postprocedure Evaluation (Signed)
  Anesthesia Post-op Note  Patient: Andrea Bailey  Procedure(s) Performed: Procedure(s) (LRB) with comments: INCISION AND DRAINAGE ABSCESS (N/A) - Excision abscess sinus tract   Patient Location: PACU  Anesthesia Type: General  Level of Consciousness: awake, alert , oriented and patient cooperative  Airway and Oxygen Therapy: Patient Spontanous Breathing  Post-op Pain: 2 /10, mild  Post-op Assessment: Post-op Vital signs reviewed, Patient's Cardiovascular Status Stable, Respiratory Function Stable, Patent Airway and Pain level controlled  Post-op Vital Signs: Reviewed and stable  Complications: No apparent anesthesia complications

## 2013-03-07 ENCOUNTER — Other Ambulatory Visit: Payer: Self-pay | Admitting: Adult Health

## 2013-06-01 ENCOUNTER — Other Ambulatory Visit: Payer: Self-pay | Admitting: Obstetrics and Gynecology

## 2013-06-03 ENCOUNTER — Other Ambulatory Visit: Payer: Self-pay | Admitting: Obstetrics and Gynecology

## 2013-06-07 ENCOUNTER — Other Ambulatory Visit: Payer: Self-pay | Admitting: Obstetrics and Gynecology

## 2013-06-08 ENCOUNTER — Other Ambulatory Visit: Payer: Self-pay | Admitting: Obstetrics and Gynecology

## 2013-07-07 ENCOUNTER — Other Ambulatory Visit: Payer: Self-pay | Admitting: Obstetrics and Gynecology

## 2013-07-07 DIAGNOSIS — Z139 Encounter for screening, unspecified: Secondary | ICD-10-CM

## 2013-07-13 ENCOUNTER — Ambulatory Visit (HOSPITAL_COMMUNITY)
Admission: RE | Admit: 2013-07-13 | Discharge: 2013-07-13 | Disposition: A | Discharge status: Home / Self Care | Payer: Managed Care, Other (non HMO) | Source: Ambulatory Visit | Attending: Obstetrics and Gynecology | Admitting: Obstetrics and Gynecology

## 2013-07-13 DIAGNOSIS — Z139 Encounter for screening, unspecified: Secondary | ICD-10-CM

## 2013-07-13 DIAGNOSIS — Z1231 Encounter for screening mammogram for malignant neoplasm of breast: Secondary | ICD-10-CM | POA: Insufficient documentation

## 2013-07-21 ENCOUNTER — Other Ambulatory Visit: Payer: Self-pay | Admitting: Obstetrics and Gynecology

## 2013-07-21 DIAGNOSIS — R928 Other abnormal and inconclusive findings on diagnostic imaging of breast: Secondary | ICD-10-CM

## 2013-08-05 ENCOUNTER — Other Ambulatory Visit: Payer: Self-pay | Admitting: Obstetrics and Gynecology

## 2013-08-05 ENCOUNTER — Ambulatory Visit (HOSPITAL_COMMUNITY)
Admission: RE | Admit: 2013-08-05 | Discharge: 2013-08-05 | Disposition: A | Discharge status: Home / Self Care | Payer: Managed Care, Other (non HMO) | Source: Ambulatory Visit | Attending: Obstetrics and Gynecology | Admitting: Obstetrics and Gynecology

## 2013-08-05 DIAGNOSIS — R928 Other abnormal and inconclusive findings on diagnostic imaging of breast: Secondary | ICD-10-CM

## 2013-10-26 ENCOUNTER — Ambulatory Visit (INDEPENDENT_AMBULATORY_CARE_PROVIDER_SITE_OTHER): Payer: Managed Care, Other (non HMO) | Admitting: Adult Health

## 2013-10-26 ENCOUNTER — Encounter (INDEPENDENT_AMBULATORY_CARE_PROVIDER_SITE_OTHER): Payer: Self-pay

## 2013-10-26 ENCOUNTER — Encounter: Payer: Self-pay | Admitting: Adult Health

## 2013-10-26 VITALS — BP 112/70 | HR 74 | Ht 65.0 in | Wt 166.0 lb

## 2013-10-26 DIAGNOSIS — Z309 Encounter for contraceptive management, unspecified: Secondary | ICD-10-CM

## 2013-10-26 DIAGNOSIS — Z8619 Personal history of other infectious and parasitic diseases: Secondary | ICD-10-CM

## 2013-10-26 DIAGNOSIS — N898 Other specified noninflammatory disorders of vagina: Secondary | ICD-10-CM

## 2013-10-26 DIAGNOSIS — Z01419 Encounter for gynecological examination (general) (routine) without abnormal findings: Secondary | ICD-10-CM

## 2013-10-26 DIAGNOSIS — K649 Unspecified hemorrhoids: Secondary | ICD-10-CM

## 2013-10-26 DIAGNOSIS — R252 Cramp and spasm: Secondary | ICD-10-CM

## 2013-10-26 DIAGNOSIS — Z1212 Encounter for screening for malignant neoplasm of rectum: Secondary | ICD-10-CM

## 2013-10-26 HISTORY — DX: Unspecified hemorrhoids: K64.9

## 2013-10-26 HISTORY — DX: Cramp and spasm: R25.2

## 2013-10-26 HISTORY — DX: Personal history of other infectious and parasitic diseases: Z86.19

## 2013-10-26 HISTORY — DX: Other specified noninflammatory disorders of vagina: N89.8

## 2013-10-26 HISTORY — DX: Encounter for contraceptive management, unspecified: Z30.9

## 2013-10-26 LAB — HEMOCCULT GUIAC POC 1CARD (OFFICE): Fecal Occult Blood, POC: NEGATIVE

## 2013-10-26 MED ORDER — CYCLOBENZAPRINE HCL 5 MG PO TABS: 5.0000 mg | ORAL_TABLET | Freq: Three times a day (TID) | ORAL | Status: DC | PRN

## 2013-10-26 MED ORDER — NYSTATIN-TRIAMCINOLONE 100000-0.1 UNIT/GM-% EX CREA: 1.0000 "application " | TOPICAL_CREAM | Freq: Two times a day (BID) | CUTANEOUS | Status: DC

## 2013-10-26 NOTE — Patient Instructions (Signed)
Physical in 1 year Mammogram yearly Try stretches and flexeril Warm legs at bedtime Leg Cramps Leg cramps that occur during exercise can be caused by poor circulation or dehydration. However, muscle cramps that occur at rest or during the night are usually not due to any serious medical problem. Heat cramps may cause muscle spasms during hot weather.  CAUSES There is no clear cause for muscle cramps. However, dehydration may be a factor for those who do not drink enough fluids and those who exercise in the heat. Imbalances in the level of sodium, potassium, calcium or magnesium in the muscle tissue may also be a factor. Some medications, such as water pills (diuretics), may cause loss of chemicals that the body needs (like sodium and potassium) and cause muscle cramps. TREATMENT   Make sure your diet has enough fluids and essential minerals for the muscle to work normally.  Avoid strenuous exercise for several days if you have been having frequent leg cramps.  Stretch and massage the cramped muscle for several minutes.  Some medicines may be helpful in some patients with night cramps. Only take over-the-counter or prescription medicines as directed by your caregiver. SEEK IMMEDIATE MEDICAL CARE IF:   Your leg cramps become worse.  Your foot becomes cold, numb, or blue. Document Released: 12/27/2004 Document Revised: 02/11/2012 Document Reviewed: 12/14/2008 [facility] Patient Information 2014 [place], [place]. Call me in 2 weeks

## 2013-10-26 NOTE — Progress Notes (Signed)
Patient ID: Andrea Bailey, female   DOB: 1966/10/19, 47 y.o.   MRN: [id number] History of Present Illness: Andrea Bailey is a 47 year old white female in for a physical.She had a normal pap with negative HPV in 2013. Works rotating shifts and keeps grandchild.Labs from work reviewed.Had colonoscopy  2 years ago.  Current Medications, Allergies, Past Medical History, Past Surgical History, Family History and Social History were reviewed in [name] record.     Review of Systems Patient denies any headaches, blurred vision, shortness of breath, chest pain, abdominal pain, problems with bowel movements, urination, or intercourse, not having sex.She has leg cramps, she is sp back surgery and has numb spots, says surgeon aware, and has discomfort in right jaw, denies grinding teeth, and itching in clitoris area.No mood changes. BP 112/70  Pulse 74  Ht 5\' 5"  (1.651 m)  Wt 166 lb (75.297 kg)  BMI 27.62 kg/m2 Physical Exam: General:  Well developed, well nourished, no acute distress Skin:  Warm and dry Neck:  Midline trachea, normal thyroid, no swelling or lymph nodes noted or popping of jaw Lungs; Clear to auscultation bilaterally Breast:  No dominant palpable mass, retraction, or nipple discharge Cardiovascular: Regular rate and rhythm Abdomen:  Soft, non tender, no hepatosplenomegaly Pelvic:  External genitalia is normal in appearance.  The vagina is normal in appearance.  The cervix is bulbous.  Uterus is felt to be normal size, shape, and contour.  No  adnexal masses or tenderness noted. Rectal: Good sphincter tone, no polyps, has internal and external hemorrhoids .  Hemoccult negative. Extremities:  No swelling or varicosities noted Psych:  No mood changes, alert and cooperative   Impression: Yearly exam no pap Vaginal itch Leg cramps Hemorrhoids Contraceptive management History of herpes   Plan: Try stretching  Get legs warm before bed Rx flexeril 5 mg #30 1  every 8 hours prn with 1 refill Rx mytrex cream to use 2-3 x daily with 3 refills Continue other meds,   Physical in 1 year Mammogram yearly Call in 2 weeks in follow up

## 2013-12-28 ENCOUNTER — Ambulatory Visit (INDEPENDENT_AMBULATORY_CARE_PROVIDER_SITE_OTHER): Payer: Managed Care, Other (non HMO) | Admitting: Family Medicine

## 2013-12-28 ENCOUNTER — Encounter: Payer: Self-pay | Admitting: Family Medicine

## 2013-12-28 VITALS — BP 136/84 | Temp 98.8°F | Ht 65.0 in | Wt 166.0 lb

## 2013-12-28 DIAGNOSIS — K1120 Sialoadenitis, unspecified: Secondary | ICD-10-CM

## 2013-12-28 NOTE — Progress Notes (Signed)
   Subjective:    Patient ID: Andrea Bailey, female    DOB: Aug 16, 1966, 48 y.o.   MRN: [id number]  HPI Patient is here today due to facial swelling on the right side of her face.  She says it is worst in the morning. It does not happen every day. It well stay swollen for a couple of hours. It hurts her jaw when she eats.  This has been going on for 6 months now.  Right parotid region swells, incomvfortabe  Lasts for a couple hrs, often later in the day,  Full sens at times Day numb [phone] (rotates first and sec shift)  Sharp pains at times  persitent numbness and discomfort, in the right leg, progressive in nature  Back acts up at times two, had two back surgeries, Fusion of low back and neck, neurosurg--dr ritzer  Review of Systems No chest pain no back pain no abdominal pain no change in bowel habits ROS otherwise negative    Objective:   Physical Exam  Alert no apparent distress. Lungs clear. Heart regular rate and rhythm. Neck supple no lymphadenopathy HEENT normal oral exam normal throat exam normal.      Assessment & Plan:  Impression intermittent parotid gland enlargement. This well could represent a partial intermittent obstruction in the Dr. Discussed with patient at length.. This warrants a ENT referral. Plan

## 2014-01-11 ENCOUNTER — Telehealth: Payer: Self-pay | Admitting: Family Medicine

## 2014-01-11 NOTE — Telephone Encounter (Signed)
[facility] to notify pt referral was sent, their office will call pt to schedule, pt to call me if needed

## 2014-01-11 NOTE — Telephone Encounter (Signed)
Patient calling to find out status to referral for ENT

## 2014-02-11 ENCOUNTER — Ambulatory Visit (INDEPENDENT_AMBULATORY_CARE_PROVIDER_SITE_OTHER): Payer: Managed Care, Other (non HMO) | Admitting: Otolaryngology

## 2014-02-11 ENCOUNTER — Other Ambulatory Visit: Payer: Self-pay | Admitting: Adult Health

## 2014-02-11 DIAGNOSIS — H6120 Impacted cerumen, unspecified ear: Secondary | ICD-10-CM

## 2014-02-11 DIAGNOSIS — K1120 Sialoadenitis, unspecified: Secondary | ICD-10-CM

## 2014-02-11 DIAGNOSIS — H9209 Otalgia, unspecified ear: Secondary | ICD-10-CM

## 2014-05-04 ENCOUNTER — Other Ambulatory Visit: Payer: Self-pay | Admitting: Obstetrics and Gynecology

## 2014-05-18 ENCOUNTER — Other Ambulatory Visit: Payer: Self-pay | Admitting: Obstetrics and Gynecology

## 2014-06-07 ENCOUNTER — Telehealth: Payer: Self-pay | Admitting: Adult Health

## 2014-06-07 NOTE — Telephone Encounter (Signed)
Spoke with pt. She is requesting a refill on Valtrex. I looked in the chart and Dr. Emelda FearFerguson approved med with 11 refills on 05/19/14. Pt aware. JSY

## 2014-06-17 ENCOUNTER — Other Ambulatory Visit: Payer: Self-pay | Admitting: Obstetrics and Gynecology

## 2014-06-18 ENCOUNTER — Telehealth: Payer: Self-pay | Admitting: Certified Registered Nurse Anesthetist

## 2014-06-18 MED ORDER — VALACYCLOVIR HCL 1 G PO TABS: 1000.0000 mg | ORAL_TABLET | Freq: Every day | ORAL | Status: DC

## 2014-06-18 NOTE — Telephone Encounter (Signed)
I called the pharmacy and spoke with them they had not received the e-scribed Rx. I resent the Rx to the pharmacy [facility] to check with her pharmacy later today.

## 2014-06-21 NOTE — Telephone Encounter (Signed)
No med listed

## 2014-10-04 ENCOUNTER — Encounter: Payer: Self-pay | Admitting: Family Medicine

## 2014-11-02 ENCOUNTER — Encounter: Payer: Self-pay | Admitting: Adult Health

## 2014-11-02 ENCOUNTER — Ambulatory Visit (INDEPENDENT_AMBULATORY_CARE_PROVIDER_SITE_OTHER): Payer: Managed Care, Other (non HMO) | Admitting: Adult Health

## 2014-11-02 VITALS — BP 130/76 | HR 76 | Ht 65.25 in | Wt 160.5 lb

## 2014-11-02 DIAGNOSIS — Z8619 Personal history of other infectious and parasitic diseases: Secondary | ICD-10-CM

## 2014-11-02 DIAGNOSIS — Z01419 Encounter for gynecological examination (general) (routine) without abnormal findings: Secondary | ICD-10-CM

## 2014-11-02 DIAGNOSIS — Z1212 Encounter for screening for malignant neoplasm of rectum: Secondary | ICD-10-CM

## 2014-11-02 DIAGNOSIS — Z3041 Encounter for surveillance of contraceptive pills: Secondary | ICD-10-CM

## 2014-11-02 DIAGNOSIS — N898 Other specified noninflammatory disorders of vagina: Secondary | ICD-10-CM

## 2014-11-02 DIAGNOSIS — J069 Acute upper respiratory infection, unspecified: Secondary | ICD-10-CM

## 2014-11-02 DIAGNOSIS — K649 Unspecified hemorrhoids: Secondary | ICD-10-CM

## 2014-11-02 HISTORY — DX: Acute upper respiratory infection, unspecified: J06.9

## 2014-11-02 LAB — HEMOCCULT GUIAC POC 1CARD (OFFICE): FECAL OCCULT BLD: NEGATIVE

## 2014-11-02 MED ORDER — VALACYCLOVIR HCL 1 G PO TABS: 1000.0000 mg | ORAL_TABLET | Freq: Every day | ORAL | Status: DC

## 2014-11-02 MED ORDER — AZITHROMYCIN 250 MG PO TABS: ORAL_TABLET | ORAL | Status: DC

## 2014-11-02 MED ORDER — FLUCONAZOLE 100 MG PO TABS: ORAL_TABLET | ORAL | Status: DC

## 2014-11-02 MED ORDER — NORETHINDRONE-ETH ESTRADIOL 1-5 MG-MCG PO TABS: 1.0000 | ORAL_TABLET | Freq: Every day | ORAL | Status: DC

## 2014-11-02 NOTE — Patient Instructions (Signed)
Upper Respiratory Infection, Adult An upper respiratory infection (URI) is also sometimes known as the common cold. The upper respiratory tract includes the nose, sinuses, throat, trachea, and bronchi. Bronchi are the airways leading to the lungs. Most people improve within 1 week, but symptoms can last up to 2 weeks. A residual cough may last even longer.  CAUSES Many different viruses can infect the tissues lining the upper respiratory tract. The tissues become irritated and inflamed and often become very moist. Mucus production is also common. A cold is contagious. You can easily spread the virus to others by oral contact. This includes kissing, sharing a glass, coughing, or sneezing. Touching your mouth or nose and then touching a surface, which is then touched by another person, can also spread the virus. SYMPTOMS  Symptoms typically develop 1 to 3 days after you come in contact with a cold virus. Symptoms vary from person to person. They may include:  Runny nose.  Sneezing.  Nasal congestion.  Sinus irritation.  Sore throat.  Loss of voice (laryngitis).  Cough.  Fatigue.  Muscle aches.  Loss of appetite.  Headache.  Low-grade fever. DIAGNOSIS  You might diagnose your own cold based on familiar symptoms, since most people get a cold 2 to 3 times a year. Your caregiver can confirm this based on your exam. Most importantly, your caregiver can check that your symptoms are not due to another disease such as strep throat, sinusitis, pneumonia, asthma, or epiglottitis. Blood tests, throat tests, and X-rays are not necessary to diagnose a common cold, but they may sometimes be helpful in excluding other more serious diseases. Your caregiver will decide if any further tests are required. RISKS AND COMPLICATIONS  You may be at risk for a more severe case of the common cold if you smoke cigarettes, have chronic heart disease (such as heart failure) or lung disease (such as asthma), or if  you have a weakened immune system. The very young and very old are also at risk for more serious infections. Bacterial sinusitis, middle ear infections, and bacterial pneumonia can complicate the common cold. The common cold can worsen asthma and chronic obstructive pulmonary disease (COPD). Sometimes, these complications can require emergency medical care and may be life-threatening. PREVENTION  The best way to protect against getting a cold is to practice good hygiene. Avoid oral or hand contact with people with cold symptoms. Wash your hands often if contact occurs. There is no clear evidence that vitamin C, vitamin E, echinacea, or exercise reduces the chance of developing a cold. However, it is always recommended to get plenty of rest and practice good nutrition. TREATMENT  Treatment is directed at relieving symptoms. There is no cure. Antibiotics are not effective, because the infection is caused by a virus, not by bacteria. Treatment may include:  Increased fluid intake. Sports drinks offer valuable electrolytes, sugars, and fluids.  Breathing heated mist or steam (vaporizer or shower).  Eating chicken soup or other clear broths, and maintaining good nutrition.  Getting plenty of rest.  Using gargles or lozenges for comfort.  Controlling fevers with ibuprofen or acetaminophen as directed by your caregiver.  Increasing usage of your inhaler if you have asthma. Zinc gel and zinc lozenges, taken in the first 24 hours of the common cold, can shorten the duration and lessen the severity of symptoms. Pain medicines may help with fever, muscle aches, and throat pain. A variety of non-prescription medicines are available to treat congestion and runny nose. Your caregiver   can make recommendations and may suggest nasal or lung inhalers for other symptoms.  HOME CARE INSTRUCTIONS   Only take over-the-counter or prescription medicines for pain, discomfort, or fever as directed by your  caregiver.  Use a warm mist humidifier or inhale steam from a shower to increase air moisture. This may keep secretions moist and make it easier to breathe.  Drink enough water and fluids to keep your urine clear or pale yellow.  Rest as needed.  Return to work when your temperature has returned to normal or as your caregiver advises. You may need to stay home longer to avoid infecting others. You can also use a face mask and careful hand washing to prevent spread of the virus. SEEK MEDICAL CARE IF:   After the first few days, you feel you are getting worse rather than better.  You need your caregiver's advice about medicines to control symptoms.  You develop chills, worsening shortness of breath, or brown or red sputum. These may be signs of pneumonia.  You develop yellow or brown nasal discharge or pain in the face, especially when you bend forward. These may be signs of sinusitis.  You develop a fever, swollen neck glands, pain with swallowing, or white areas in the back of your throat. These may be signs of strep throat. SEEK IMMEDIATE MEDICAL CARE IF:   You have a fever.  You develop severe or persistent headache, ear pain, sinus pain, or chest pain.  You develop wheezing, a prolonged cough, cough up blood, or have a change in your usual mucus (if you have chronic lung disease).  You develop sore muscles or a stiff neck. Document Released: 05/15/2001 Document Revised: 02/11/2012 Document Reviewed: 02/24/2014 [facility] Patient Information 2015 [place], [place]. This information is not intended to replace advice given to you by your health care provider. Make sure you discuss any questions you have with your health care provider. Hemorrhoids Hemorrhoids are swollen veins around the rectum or anus. There are two types of hemorrhoids:   Internal hemorrhoids. These occur in the veins just inside the rectum. They may poke through to the outside and become irritated and  painful.  External hemorrhoids. These occur in the veins outside the anus and can be felt as a painful swelling or hard lump near the anus. CAUSES  Pregnancy.   Obesity.   Constipation or diarrhea.   Straining to have a bowel movement.   Sitting for long periods on the toilet.  Heavy lifting or other activity that caused you to strain.  Anal intercourse. SYMPTOMS   Pain.   Anal itching or irritation.   Rectal bleeding.   Fecal leakage.   Anal swelling.   One or more lumps around the anus.  DIAGNOSIS  Your caregiver may be able to diagnose hemorrhoids by visual examination. Other examinations or tests that may be performed include:   Examination of the rectal area with a gloved hand (digital rectal exam).   Examination of anal canal using a small tube (scope).   A blood test if you have lost a significant amount of blood.  A test to look inside the colon (sigmoidoscopy or colonoscopy). TREATMENT Most hemorrhoids can be treated at home. However, if symptoms do not seem to be getting better or if you have a lot of rectal bleeding, your caregiver may perform a procedure to help make the hemorrhoids get smaller or remove them completely. Possible treatments include:   Placing a rubber band at the base of the hemorrhoid  to cut off the circulation (rubber band ligation).   Injecting a chemical to shrink the hemorrhoid (sclerotherapy).   Using a tool to burn the hemorrhoid (infrared light therapy).   Surgically removing the hemorrhoid (hemorrhoidectomy).   Stapling the hemorrhoid to block blood flow to the tissue (hemorrhoid stapling).  HOME CARE INSTRUCTIONS   Eat foods with fiber, such as whole grains, beans, nuts, fruits, and vegetables. Ask your doctor about taking products with added fiber in them (fibersupplements).  Increase fluid intake. Drink enough water and fluids to keep your urine clear or pale yellow.   Exercise regularly.   Go to  the bathroom when you have the urge to have a bowel movement. Do not wait.   Avoid straining to have bowel movements.   Keep the anal area dry and clean. Use wet toilet paper or moist towelettes after a bowel movement.   Medicated creams and suppositories may be used or applied as directed.   Only take over-the-counter or prescription medicines as directed by your caregiver.   Take warm sitz baths for 15-20 minutes, 3-4 times a day to ease pain and discomfort.   Place ice packs on the hemorrhoids if they are tender and swollen. Using ice packs between sitz baths may be helpful.   Put ice in a plastic bag.   Place a towel between your skin and the bag.   Leave the ice on for 15-20 minutes, 3-4 times a day.   Do not use a donut-shaped pillow or sit on the toilet for long periods. This increases blood pooling and pain.  SEEK MEDICAL CARE IF:  You have increasing pain and swelling that is not controlled by treatment or medicine.  You have uncontrolled bleeding.  You have difficulty or you are unable to have a bowel movement.  You have pain or inflammation outside the area of the hemorrhoids. MAKE SURE YOU:  Understand these instructions.  Will watch your condition.  Will get help right away if you are not doing well or get worse. Document Released: 11/16/2000 Document Revised: 11/05/2012 Document Reviewed: 09/23/2012 [facility] Patient Information 2015 [place], [place]. This information is not intended to replace advice given to you by your health care provider. Make sure you discuss any questions you have with your health care provider. Pap and physical in 1 year Get mammogram now and yearly Colonoscopy per GI

## 2014-11-02 NOTE — Progress Notes (Signed)
Patient ID: Janene HarveyCindy L Soderberg, female   DOB: [birth year], 48 y.o.   MRN: [id number] History of Present Illness: [name] is a 48 year old white female in for gyn exam.She complains of cough and congestion in her head for 2 weeks, and hemorrhoids, has had bleeding in past, and itching clitoral area.She had labs at work and they were good.Neck hurts at times, has history of bulging disc with surgery.   Current Medications, Allergies, Past Medical History, Past Surgical History, Family History and Social History were reviewed in [facility] electronic medical record.     Review of Systems: Patient denies any headaches, blurred vision, shortness of breath, chest pain, abdominal pain, problems with bowel movements, urination, or intercourse. No joint pain or mood swings.See HPI for positives.    Physical Exam:BP 130/76 mmHg  Pulse 76  Ht 5' 5.25" (1.657 m)  Wt 160 lb 8 oz (72.802 kg)  BMI 26.52 kg/m2 General:  Well developed, well nourished, no acute distress Skin:  Warm and dry Neck:  Midline trachea, normal thyroid, no facial tenderness Lungs; Clear to auscultation bilaterally Breast:  No dominant palpable mass, retraction, or nipple discharge Cardiovascular: Regular rate and rhythm Abdomen:  Soft, non tender, no hepatosplenomegaly Pelvic:  External genitalia is normal in appearance.  The vagina is normal in appearance.  The cervix is bulbous.  Uterus is felt to be normal size, shape, and contour.  No  adnexal masses or tenderness noted. Rectal: Good sphincter tone, no polyps,interanl and external hemorrhoids felt.  Hemoccult negative. Extremities:  No swelling or varicosities noted Psych:  No mood changes,alert and cooperative,seems happy   Impression: Well woman gyn exam no pap Hemorrhoids Vaginal itch Contraceptive management URI History of HSV    Plan: Refilled Jinteli x 1 year Refilled valtrex 1 gm 1 daily x 1 year Rx Z pack Rx diflucan 100 mg 1 bid x 10 days Pap and physical  in 1 year Mammogram  Now and yearly Colonoscopy per GI Labs at work Use preparation H, review handout on hemorrhoid banding Review handout on URI, push fluids and rest

## 2015-01-31 ENCOUNTER — Other Ambulatory Visit: Payer: Self-pay | Admitting: *Deleted

## 2015-01-31 MED ORDER — NORETHINDRONE-ETH ESTRADIOL 1-5 MG-MCG PO TABS: 1.0000 | ORAL_TABLET | Freq: Every day | ORAL | Status: DC

## 2015-08-23 ENCOUNTER — Other Ambulatory Visit: Payer: Self-pay | Admitting: Obstetrics and Gynecology

## 2015-08-23 DIAGNOSIS — Z1231 Encounter for screening mammogram for malignant neoplasm of breast: Secondary | ICD-10-CM

## 2015-09-19 ENCOUNTER — Ambulatory Visit (HOSPITAL_COMMUNITY)
Admission: RE | Admit: 2015-09-19 | Discharge: 2015-09-19 | Disposition: A | Discharge status: Home / Self Care | Payer: Managed Care, Other (non HMO) | Source: Ambulatory Visit | Attending: Obstetrics and Gynecology | Admitting: Obstetrics and Gynecology

## 2015-09-19 DIAGNOSIS — Z1231 Encounter for screening mammogram for malignant neoplasm of breast: Secondary | ICD-10-CM | POA: Insufficient documentation

## 2015-11-08 ENCOUNTER — Encounter: Payer: Self-pay | Admitting: Adult Health

## 2015-11-08 ENCOUNTER — Other Ambulatory Visit: Payer: Self-pay | Admitting: Adult Health

## 2015-11-08 ENCOUNTER — Other Ambulatory Visit (HOSPITAL_COMMUNITY)
Admission: RE | Admit: 2015-11-08 | Discharge: 2015-11-08 | Disposition: A | Discharge status: Home / Self Care | Payer: Managed Care, Other (non HMO) | Source: Ambulatory Visit | Attending: Adult Health | Admitting: Adult Health

## 2015-11-08 ENCOUNTER — Ambulatory Visit (INDEPENDENT_AMBULATORY_CARE_PROVIDER_SITE_OTHER): Payer: Managed Care, Other (non HMO) | Admitting: Adult Health

## 2015-11-08 VITALS — BP 130/78 | HR 80 | Ht 65.25 in | Wt 164.0 lb

## 2015-11-08 DIAGNOSIS — Z01419 Encounter for gynecological examination (general) (routine) without abnormal findings: Secondary | ICD-10-CM | POA: Diagnosis not present

## 2015-11-08 DIAGNOSIS — K649 Unspecified hemorrhoids: Secondary | ICD-10-CM

## 2015-11-08 DIAGNOSIS — Z79890 Hormone replacement therapy (postmenopausal): Secondary | ICD-10-CM

## 2015-11-08 DIAGNOSIS — Z1151 Encounter for screening for human papillomavirus (HPV): Secondary | ICD-10-CM | POA: Diagnosis present

## 2015-11-08 DIAGNOSIS — Z1211 Encounter for screening for malignant neoplasm of colon: Secondary | ICD-10-CM

## 2015-11-08 DIAGNOSIS — N898 Other specified noninflammatory disorders of vagina: Secondary | ICD-10-CM

## 2015-11-08 DIAGNOSIS — Z8619 Personal history of other infectious and parasitic diseases: Secondary | ICD-10-CM

## 2015-11-08 HISTORY — DX: Hormone replacement therapy: Z79.890

## 2015-11-08 LAB — HEMOCCULT GUIAC POC 1CARD (OFFICE): FECAL OCCULT BLD: NEGATIVE

## 2015-11-08 MED ORDER — CLOBETASOL PROPIONATE 0.05 % EX CREA: 1.0000 "application " | TOPICAL_CREAM | Freq: Two times a day (BID) | CUTANEOUS | Status: DC

## 2015-11-08 MED ORDER — NORETHINDRONE-ETH ESTRADIOL 1-5 MG-MCG PO TABS
1.0000 | ORAL_TABLET | Freq: Every day | ORAL | Status: DC
Start: 2015-11-08 — End: 2016-01-09

## 2015-11-08 MED ORDER — VALACYCLOVIR HCL 1 G PO TABS: 1000.0000 mg | ORAL_TABLET | Freq: Every day | ORAL | Status: DC

## 2015-11-08 NOTE — Progress Notes (Signed)
Patient ID: Andrea Bailey, female   DOB: 1966-09-10, 49 y.o. Andrea Bailey  MRN: [id number] History of Present Illness:  [name] is a 49 year old white female in for a well woman gyn exam and pap, she says she is much better since starting HRT, she tried to stop and hot flashes returned,so she resumed them.She complains of itching near clitoris.She had labs at work.She declines flu shot and also declines smoking cessation at this time.She has had more nipple tenderness recently, but had normal mammogram in October, told her it was probably hormonal.She has also had frequent sty's on lower eye lids.  Current Medications, Allergies, Past Medical History, Past Surgical History, Family History and Social History were reviewed in [facility] electronic medical record.     Review of Systems: Patient denies any headaches, hearing loss, fatigue, blurred vision, shortness of breath, chest pain, abdominal pain, problems with bowel movements(has seen blood in toilet after BM, has known hemorrhoids, urination, or intercourse. No joint pain or mood swings.See HPI for positives. She had colonoscopy in 2012, by Dr Darrick PennaFields.   Physical Exam:BP 130/78 mmHg  Pulse 80  Ht 5' 5.25" (1.657 m)  Wt 164 lb (74.39 kg)  BMI 27.09 kg/m2  General:  Well developed, well nourished, no acute distress Skin:  Warm and dry,sty left lower lid,it is dry, told to throw make up away Neck:  Midline trachea, normal thyroid, good ROM, no lymphadenopathy Lungs; Clear to auscultation bilaterally Breast:  No dominant palpable mass, retraction, or nipple discharge Cardiovascular: Regular rate and rhythm Abdomen:  Soft, non tender, no hepatosplenomegaly Pelvic:  External genitalia is normal in appearance, no lesions.  The vagina is normal in appearance. Urethra has no lesions or masses. The cervix is bulbous.Pap with HPV performed.  Uterus is felt to be normal size, shape, and contour.  No adnexal masses or tenderness noted.Bladder is non tender, no  masses felt. Rectal: Good sphincter tone, no polyps, internal hemorrhoids felt, and has external ones.  Hemoccult negative. Extremities/musculoskeletal:  No swelling or varicosities noted, no clubbing or cyanosis Psych:  No mood changes, alert and cooperative,seems happy Discussed hemorrhoid banding.Reviewed labs from work and will have scanned to chart.  Impression: Well woman gyn exam and pap HRT Hemorrhoids  Vaginal itch History of HSV   Plan: Reviewed handout on hemorrhoid banding Physical in 1 year, pap in 3 if normal with negative HPV Mammogram yearly Refilled Jinteli #90 take 1 daily with 3 refills Refilled valtrex 1 gm take 1 daily #30 with 11 refills Rx temovate 0.05% cream use bid to clitoris area that itches,#30 gm with 1 refill Follow up in 6 weeks for recheck of itching

## 2015-11-08 NOTE — Patient Instructions (Signed)
Follow up in 6 weeks Physical in 1 year, pap in 3 years  Mammogram yearly  Use temovate at itchy spot

## 2015-11-09 LAB — CYTOLOGY - PAP

## 2015-12-20 ENCOUNTER — Ambulatory Visit (INDEPENDENT_AMBULATORY_CARE_PROVIDER_SITE_OTHER): Payer: Self-pay | Admitting: Adult Health

## 2015-12-20 ENCOUNTER — Encounter: Payer: Self-pay | Admitting: Adult Health

## 2015-12-20 VITALS — BP 122/70 | HR 78 | Ht 66.0 in | Wt 165.5 lb

## 2015-12-20 DIAGNOSIS — L298 Other pruritus: Secondary | ICD-10-CM

## 2015-12-20 DIAGNOSIS — N898 Other specified noninflammatory disorders of vagina: Secondary | ICD-10-CM

## 2015-12-20 NOTE — Progress Notes (Signed)
Subjective:     Patient ID: Andrea Bailey, female   DOB: 11-May-1966, 50 y.o.   MRN: [id number]  HPI Andrea Bailey is a 50 year old white female, back in follow up of using temovate for itching at clitoral area, has noticed when stands after voiding may leak urine, instructed to void, stand and then sit and void again.  Review of Systems Patient denies any headaches, hearing loss, fatigue, blurred vision, shortness of breath, chest pain, abdominal pain, problems with bowel movements,  or intercourse(not having sex). No joint pain or mood swings. See HPI for positives. Reviewed past medical,surgical, social and family history. Reviewed medications and allergies.     Objective:   Physical Exam BP 122/70 mmHg  Pulse 78  Ht  (1.676 m)  Wt 165 lb 8 oz (75.07 kg)  BMI 26.73 kg/m2Skin warm and dry, external genitalia normal in appearance, no lesions, skin pink, no swelling, since no itching when sues cream keep using for now.    Assessment:     Itching vaginal area near clitoris    Plan:     Continue temovate bid Follow up prn

## 2015-12-20 NOTE — Patient Instructions (Signed)
Continue teomvate bid Follow up prn

## 2016-01-07 ENCOUNTER — Other Ambulatory Visit: Payer: Self-pay | Admitting: Adult Health

## 2016-05-11 DIAGNOSIS — J329 Chronic sinusitis, unspecified: Secondary | ICD-10-CM | POA: Diagnosis not present

## 2016-05-11 DIAGNOSIS — J069 Acute upper respiratory infection, unspecified: Secondary | ICD-10-CM | POA: Diagnosis not present

## 2016-06-13 DIAGNOSIS — M5412 Radiculopathy, cervical region: Secondary | ICD-10-CM | POA: Diagnosis not present

## 2016-06-14 ENCOUNTER — Other Ambulatory Visit (HOSPITAL_COMMUNITY): Payer: Self-pay | Admitting: Preventative Medicine

## 2016-06-14 DIAGNOSIS — M5412 Radiculopathy, cervical region: Secondary | ICD-10-CM

## 2016-06-22 ENCOUNTER — Ambulatory Visit (HOSPITAL_COMMUNITY)
Admission: RE | Admit: 2016-06-22 | Discharge: 2016-06-22 | Disposition: A | Discharge status: Home / Self Care | Payer: BLUE CROSS/BLUE SHIELD | Source: Ambulatory Visit | Attending: Preventative Medicine | Admitting: Preventative Medicine

## 2016-06-22 DIAGNOSIS — M4832 Traumatic spondylopathy, cervical region: Secondary | ICD-10-CM | POA: Insufficient documentation

## 2016-06-22 DIAGNOSIS — M5031 Other cervical disc degeneration,  high cervical region: Secondary | ICD-10-CM | POA: Diagnosis not present

## 2016-06-22 DIAGNOSIS — Z9889 Other specified postprocedural states: Secondary | ICD-10-CM | POA: Insufficient documentation

## 2016-06-22 DIAGNOSIS — M5412 Radiculopathy, cervical region: Secondary | ICD-10-CM | POA: Diagnosis not present

## 2016-06-22 DIAGNOSIS — M542 Cervicalgia: Secondary | ICD-10-CM | POA: Diagnosis not present

## 2016-06-22 MED ORDER — GADOBENATE DIMEGLUMINE 529 MG/ML IV SOLN
15.0000 mL | Freq: Once | INTRAVENOUS | Status: AC | PRN
  Administered 2016-06-22: 15 mL via INTRAVENOUS

## 2016-06-26 DIAGNOSIS — M4800 Spinal stenosis, site unspecified: Secondary | ICD-10-CM | POA: Diagnosis not present

## 2016-06-26 DIAGNOSIS — M5020 Other cervical disc displacement, unspecified cervical region: Secondary | ICD-10-CM | POA: Diagnosis not present

## 2016-07-25 DIAGNOSIS — M5020 Other cervical disc displacement, unspecified cervical region: Secondary | ICD-10-CM | POA: Insufficient documentation

## 2016-07-25 DIAGNOSIS — Z6827 Body mass index (BMI) 27.0-27.9, adult: Secondary | ICD-10-CM | POA: Diagnosis not present

## 2016-07-25 DIAGNOSIS — M50222 Other cervical disc displacement at C5-C6 level: Secondary | ICD-10-CM | POA: Diagnosis not present

## 2016-07-31 ENCOUNTER — Other Ambulatory Visit (HOSPITAL_COMMUNITY): Admission: RE | Admit: 2016-07-31 | Payer: BLUE CROSS/BLUE SHIELD | Source: Ambulatory Visit | Admitting: Neurosurgery

## 2016-07-31 ENCOUNTER — Other Ambulatory Visit (HOSPITAL_COMMUNITY)
Admission: RE | Admit: 2016-07-31 | Discharge: 2016-07-31 | Disposition: A | Discharge status: Home / Self Care | Payer: BLUE CROSS/BLUE SHIELD | Source: Ambulatory Visit | Attending: Neurosurgery | Admitting: Neurosurgery

## 2016-07-31 DIAGNOSIS — M50222 Other cervical disc displacement at C5-C6 level: Secondary | ICD-10-CM | POA: Diagnosis not present

## 2016-07-31 DIAGNOSIS — Z01812 Encounter for preprocedural laboratory examination: Secondary | ICD-10-CM | POA: Insufficient documentation

## 2016-07-31 LAB — CBC
HCT: 39.9 % (ref 36.0–46.0)
Hemoglobin: 13.2 g/dL (ref 12.0–15.0)
MCH: 32.7 pg (ref 26.0–34.0)
MCHC: 33.1 g/dL (ref 30.0–36.0)
MCV: 98.8 fL (ref 78.0–100.0)
PLATELETS: 191 10*3/uL (ref 150–400)
RBC: 4.04 MIL/uL (ref 3.87–5.11)
RDW: 13.3 % (ref 11.5–15.5)
WBC: 9 10*3/uL (ref 4.0–10.5)

## 2016-07-31 LAB — BASIC METABOLIC PANEL
Anion gap: 10 (ref 5–15)
BUN: 10 mg/dL (ref 6–20)
CALCIUM: 9.3 mg/dL (ref 8.9–10.3)
CO2: 25 mmol/L (ref 22–32)
CREATININE: 0.64 mg/dL (ref 0.44–1.00)
Chloride: 105 mmol/L (ref 101–111)
GFR calc Af Amer: >60 mL/min (ref >60)
GLUCOSE: 108 mg/dL — AB (ref 65–99)
Potassium: 3.7 mmol/L (ref 3.5–5.1)
Sodium: 140 mmol/L (ref 135–145)

## 2016-08-03 HISTORY — PX: NECK SURGERY: SHX720

## 2016-08-08 DIAGNOSIS — M50022 Cervical disc disorder at C5-C6 level with myelopathy: Secondary | ICD-10-CM | POA: Diagnosis not present

## 2016-08-08 DIAGNOSIS — M5020 Other cervical disc displacement, unspecified cervical region: Secondary | ICD-10-CM | POA: Diagnosis not present

## 2016-08-20 ENCOUNTER — Other Ambulatory Visit: Payer: Self-pay | Admitting: *Deleted

## 2016-08-20 MED ORDER — VALACYCLOVIR HCL 1 G PO TABS: 1000.0000 mg | ORAL_TABLET | Freq: Every day | ORAL | 3 refills | Status: DC

## 2016-09-04 DIAGNOSIS — M50222 Other cervical disc displacement at C5-C6 level: Secondary | ICD-10-CM | POA: Diagnosis not present

## 2016-10-02 DIAGNOSIS — M50222 Other cervical disc displacement at C5-C6 level: Secondary | ICD-10-CM | POA: Diagnosis not present

## 2016-10-04 ENCOUNTER — Emergency Department (HOSPITAL_COMMUNITY)
Admission: EM | Admit: 2016-10-04 | Discharge: 2016-10-04 | Disposition: A | Discharge status: Home / Self Care | Payer: BLUE CROSS/BLUE SHIELD | Attending: Emergency Medicine | Admitting: Emergency Medicine

## 2016-10-04 ENCOUNTER — Encounter (HOSPITAL_COMMUNITY): Payer: Self-pay

## 2016-10-04 DIAGNOSIS — Z79899 Other long term (current) drug therapy: Secondary | ICD-10-CM | POA: Diagnosis not present

## 2016-10-04 DIAGNOSIS — F17210 Nicotine dependence, cigarettes, uncomplicated: Secondary | ICD-10-CM | POA: Insufficient documentation

## 2016-10-04 DIAGNOSIS — L0231 Cutaneous abscess of buttock: Secondary | ICD-10-CM | POA: Insufficient documentation

## 2016-10-04 DIAGNOSIS — L0291 Cutaneous abscess, unspecified: Secondary | ICD-10-CM

## 2016-10-04 MED ORDER — LIDOCAINE-EPINEPHRINE 1 %-1:200000 IJ SOLN
30.0000 mL | Freq: Once | INTRAMUSCULAR | Status: AC
Start: 2016-10-04 — End: 2016-10-04
  Administered 2016-10-04: 30 mL via INTRADERMAL

## 2016-10-04 MED ORDER — LIDOCAINE-EPINEPHRINE (PF) 1 %-1:200000 IJ SOLN
INTRAMUSCULAR | Status: AC
  Filled 2016-10-04: qty 30

## 2016-10-04 MED ORDER — OXYCODONE-ACETAMINOPHEN 5-325 MG PO TABS
1.0000 | ORAL_TABLET | Freq: Once | ORAL | Status: AC
  Administered 2016-10-04: 1 via ORAL
  Filled 2016-10-04: qty 1

## 2016-10-04 MED ORDER — LIDOCAINE-EPINEPHRINE 1 %-1:100000 IJ SOLN: 10.0000 mL | Freq: Once | INTRAMUSCULAR | Status: DC

## 2016-10-04 MED ORDER — IBUPROFEN 400 MG PO TABS
600.0000 mg | ORAL_TABLET | Freq: Once | ORAL | Status: AC
  Administered 2016-10-04: 600 mg via ORAL
  Filled 2016-10-04: qty 2

## 2016-10-04 NOTE — ED Triage Notes (Signed)
Pt has a swollen and red area to the top of her right buttock.  Pt states she has had this before but this time is worse

## 2016-10-08 NOTE — ED Provider Notes (Signed)
[facility]-EMERGENCY DEPT Provider Note   CSN: [id number] Arrival date & time: 10/04/16  0028     History   Chief Complaint Chief Complaint  Patient presents with  . Abscess    HPI Andrea HarveyCindy L Casady is a 50 y.o. female.  HPI   50 year old female with a painful lesion to her right buttock. Progressively worsening over the past several days. No drainage. No fevers or chills. She has had a painful spot in the same area previously but it usually improves spontaneously by itself.  Past Medical History:  Diagnosis Date  . Back pain    buldging pain  . Contraceptive management 10/26/2013  . Hematoma 1994   after birth of second child  . Hemorrhoids   . Hemorrhoids 10/26/2013  . Herpes simplex without mention of complication   . History of herpes simplex type 2 infection 10/26/2013  . Hormone replacement therapy (HRT) 11/08/2015  . Itching in the vaginal area 10/26/2013  . Leg cramps 10/26/2013  . URI (upper respiratory infection) 11/02/2014    Patient Active Problem List   Diagnosis Date Noted  . Hormone replacement therapy (HRT) 11/08/2015  . URI (upper respiratory infection) 11/02/2014  . Itching in the vaginal area 10/26/2013  . Leg cramps 10/26/2013  . Hemorrhoids 10/26/2013  . Contraceptive management 10/26/2013  . History of herpes simplex type 2 infection 10/26/2013  . Rectal bleeding 08/27/2011    Past Surgical History:  Procedure Laterality Date  . CESAREAN SECTION  1998   along with tubal ligation  . CHOLECYSTECTOMY  several yrs ago  . COLONOSCOPY  09/05/2011   Procedure: COLONOSCOPY;  Surgeon: Arlyce HarmanSandi M Fields, MD;  Location: [facility];  Service: Endoscopy;  Laterality: N/A;  11:30  . ENDOMETRIAL ABLATION  several yrs ago  . LUMBAR LAMINECTOMY/DECOMPRESSION MICRODISCECTOMY  12/28/2011   Procedure: LUMBAR LAMINECTOMY/DECOMPRESSION MICRODISCECTOMY;  Surgeon: Reinaldo Meekerandy O Kritzer, MD;  Location: [facility];  Service: Neurosurgery;  Laterality: Right;  LUMBAR LAMINECTOMY  DECOMPRESSION MICRODISCECTOMY LUMBAR 5-SACRAL ONE  . NECK SURGERY  2011    OB History    Gravida Para Term Preterm AB Living   3 3       3    SAB TAB Ectopic Multiple Live Births           3       Home Medications    Prior to Admission medications   Medication Sig Start Date End Date Taking? Authorizing Provider  Benzocaine (BOIL-EASE EX) Apply topically.   Yes Historical Provider, MD  clobetasol cream (TEMOVATE) 0.05 % Apply 1 application topically 2 (two) times daily. 11/08/15  Yes Adline PotterJennifer A Griffin, NP  JINTELI 1-5 MG-MCG TABS tablet TAKE 1 TABLET BY MOUTH DAILY. 01/09/16  Yes Adline PotterJennifer A Griffin, NP  valACYclovir (VALTREX) 1000 MG tablet Take 1 tablet (1,000 mg total) by mouth daily. 08/20/16  Yes Adline PotterJennifer A Griffin, NP    Family History Family History  Problem Relation Age of Onset  . Lung cancer Mother   . Lung cancer Father   . Diabetes Daughter   . Cancer Sister     skin  . COPD Sister   . Colon cancer Neg Hx   . Anesthesia problems Neg Hx   . Hypotension Neg Hx   . Malignant hyperthermia Neg Hx   . Pseudochol deficiency Neg Hx     Social History Social History  Substance Use Topics  . Smoking status: Current Every Day Smoker    Packs/day: 1.00    Years: 9.00  Types: Cigarettes  . Smokeless tobacco: Never Used  . Alcohol use Yes     Comment: rarely     Allergies   Patient has no known allergies.   Review of Systems Review of Systems  All systems reviewed and negative, other than as noted in HPI.  Physical Exam Updated Vital Signs BP 125/77   Pulse 95   Temp 98.2 F (36.8 C)   Resp 18   Ht 5\' 5"  (1.651 m)   Wt 170 lb (77.1 kg)   SpO2 94%   BMI 28.29 kg/m   Physical Exam  Constitutional: She appears well-developed and well-nourished. No distress.  HENT:  Head: Normocephalic and atraumatic.  Eyes: Conjunctivae are normal. Right eye exhibits no discharge. Left eye exhibits no discharge.  Neck: Neck supple.  Cardiovascular: Normal rate,  regular rhythm and normal heart sounds.  Exam reveals no gallop and no friction rub.   No murmur heard. Pulmonary/Chest: Effort normal and breath sounds normal. No respiratory distress.  Abdominal: Soft. She exhibits no distension. There is no tenderness.  Genitourinary:     Genitourinary Comments: Region consistent with an abscess in the pictured area. This approximate size of a silver dollar. It is fluctuant. Bedside ultrasound was performed and does show a large collection. There is no significant surrounding cellulitis.  Musculoskeletal: She exhibits no edema or tenderness.  Neurological: She is alert.  Skin: Skin is warm and dry.  Psychiatric: She has a normal mood and affect. Her behavior is normal. Thought content normal.  Nursing note and vitals reviewed.    ED Treatments / Results  Labs (all labs ordered are listed, but only abnormal results are displayed) Labs Reviewed - No data to display  EKG  EKG Interpretation None       Radiology No results found.  Procedures Procedures (including critical care time)  INCISION AND DRAINAGE Performed by: Raeford RazorKOHUT, Beatriz Quintela Consent: Verbal consent obtained. Risks and benefits: risks, benefits and alternatives were discussed Type: abscess  Body area: R buttock  Anesthesia: local infiltration  Incision was made with a scalpel.  Local anesthetic: lidocaine 1% w/epinephrine  Anesthetic total: 3 ml  Complexity: complex Blunt dissection to break up loculations  Drainage: purulent  Drainage amount: large  Patient tolerance: Patient tolerated the procedure well with no immediate complications.    Medications Ordered in ED Medications  ibuprofen (ADVIL,MOTRIN) tablet 600 mg (600 mg Oral Given 10/04/16 0218)  oxyCODONE-acetaminophen (PERCOCET/ROXICET) 5-325 MG per tablet 1 tablet (1 tablet Oral Given 10/04/16 0218)  lidocaine-EPINEPHrine (XYLOCAINE-EPINEPHrine) 1 %-1:200000 (PF) injection 30 mL (30 mLs Intradermal Given  10/04/16 0220)     Initial Impression / Assessment and Plan / ED Course  I have reviewed the triage vital signs and the nursing notes.  Pertinent labs & imaging results that were available during my care of the patient were reviewed by me and considered in my medical decision making (see chart for details).  Clinical Course     50 year old female with a buttock abscess. This was incised and drained without apparent complication at bedside. No swelling cellulitis. She is nontoxic. Continued wound care and return precautions were discussed.  Final Clinical Impressions(s) / ED Diagnoses   Final diagnoses:  Abscess    New Prescriptions Discharge Medication List as of 10/04/2016  4:03 AM       Raeford RazorStephen Danene Montijo, MD 10/08/16 [phone]

## 2016-10-16 ENCOUNTER — Ambulatory Visit (INDEPENDENT_AMBULATORY_CARE_PROVIDER_SITE_OTHER): Payer: BLUE CROSS/BLUE SHIELD | Admitting: Family Medicine

## 2016-10-16 ENCOUNTER — Encounter: Payer: Self-pay | Admitting: Family Medicine

## 2016-10-16 VITALS — Temp 98.4°F | Ht 66.0 in | Wt 171.8 lb

## 2016-10-16 DIAGNOSIS — L03115 Cellulitis of right lower limb: Secondary | ICD-10-CM | POA: Diagnosis not present

## 2016-10-16 MED ORDER — DOXYCYCLINE HYCLATE 100 MG PO TABS: 100.0000 mg | ORAL_TABLET | Freq: Two times a day (BID) | ORAL | 0 refills | Status: DC

## 2016-10-16 MED ORDER — HYDROCODONE-ACETAMINOPHEN 5-325 MG PO TABS: 1.0000 | ORAL_TABLET | ORAL | 0 refills | Status: DC | PRN

## 2016-10-16 MED ORDER — CEFTRIAXONE SODIUM 1 G IJ SOLR
500.0000 mg | Freq: Once | INTRAMUSCULAR | Status: AC
  Administered 2016-10-16: 500 mg via INTRAMUSCULAR

## 2016-10-16 NOTE — Progress Notes (Deleted)
Subjective:     Patient ID: Andrea Bailey, female   DOB: 03/21/1966, 50 y.o.   MRN: [id number]  HPI   Review of Systems     Objective:   Physical Exam     Assessment:     ***    Plan:     ***

## 2016-10-16 NOTE — Progress Notes (Signed)
   Subjective:    Patient ID: Andrea Bailey, female    DOB: 03-28-1966, 50 y.o.   MRN: [id number]  HPI Patient arrives with c/o abscess on back of right thigh- started draining today but is very painful  Patient was in the emergency room several weeks ago with an abscess. Drained at that time. No culture obtained.  This was inserted up the last few days difficulty with sitting. Next  Did start to drain some last night. Next  No fever or chills. Review of Systems No rash elsewhere no GI symptoms    Objective:   Physical Exam  Alert vitals stable, NAD. Blood pressure good on repeat. HEENT normal. Lungs clear. Heart regular rate and rhythm. Posterior right upper thigh erythematous patch exquisitely tender some central drainage no fluctuance      Assessment & Plan:  Impression cellulitis with abscess potential for MRSA discussed with patient plan Rocephin injection. Doxycycline twice a day local measures discussed hope for improvement and coming days. Hydrocodone when necessary WSL

## 2016-11-02 ENCOUNTER — Ambulatory Visit (INDEPENDENT_AMBULATORY_CARE_PROVIDER_SITE_OTHER): Payer: BLUE CROSS/BLUE SHIELD | Admitting: Family Medicine

## 2016-11-02 ENCOUNTER — Encounter: Payer: Self-pay | Admitting: Family Medicine

## 2016-11-02 VITALS — BP 102/80 | Temp 98.5°F | Ht 66.0 in | Wt 168.4 lb

## 2016-11-02 DIAGNOSIS — L03115 Cellulitis of right lower limb: Secondary | ICD-10-CM

## 2016-11-02 MED ORDER — DOXYCYCLINE HYCLATE 100 MG PO TABS: 100.0000 mg | ORAL_TABLET | Freq: Two times a day (BID) | ORAL | 1 refills | Status: DC

## 2016-11-02 NOTE — Progress Notes (Signed)
   Subjective:    Patient ID: Andrea Bailey, female    DOB: June 03, 1966, 50 y.o.   MRN: [id number]  HPI Patient is here today for a abscess on her right leg. Onset 1 month ago. Treatments tried: antibiotic with mild relief.   Patient had surgery couple months ago wonders if it's related. But on further history he has had other ulcers in the past.  Please see prior note. That particular patch of abscess improved on that treatment.  Some discharge evident side with tenderness  Patient has no other concerns at this time.    Review of Systems No headache, no major weight loss or weight gain, no chest pain no back pain abdominal pain no change in bowel habits complete ROS otherwise negative     Objective:   Physical Exam  Alert vitals stable, NAD. Blood pressure good on repeat. HEENT normal. Lungs clear. Heart regular rate and rhythm.  erythematous patchBase of right thigh. No fluctuance quite tender some induration inside of discharge evident      Assessment & Plan:  Impression skin structure infection plan docs see twice a day 10 days. Symptom care discussed local measures discussed

## 2016-11-05 ENCOUNTER — Telehealth: Payer: Self-pay | Admitting: Family Medicine

## 2016-11-05 NOTE — Telephone Encounter (Signed)
Nurse's-please talk with the patient. If she is not having any discharge from this area and she is keeping it covered it would not be considered contagious. Talk with her-if she confirms this and wants a note stating it is not contagious feel free to go ahead and write that know and I will sign thank you

## 2016-11-05 NOTE — Telephone Encounter (Signed)
Patient states that she has no discharge and is not contagious. Letter completed and ready for pickup. Patient aware. Faxed to number below.

## 2016-11-05 NOTE — Telephone Encounter (Signed)
Patient job requesting a note for work that she is not contagious. Fax to [phone]

## 2016-11-05 NOTE — Telephone Encounter (Signed)
Left message return call 11/05/16 

## 2016-11-06 ENCOUNTER — Other Ambulatory Visit (HOSPITAL_COMMUNITY): Payer: Self-pay | Admitting: Neurosurgery

## 2016-11-06 DIAGNOSIS — M50222 Other cervical disc displacement at C5-C6 level: Secondary | ICD-10-CM

## 2016-11-13 ENCOUNTER — Ambulatory Visit (HOSPITAL_COMMUNITY)
Admission: RE | Admit: 2016-11-13 | Discharge: 2016-11-13 | Disposition: A | Discharge status: Home / Self Care | Payer: BLUE CROSS/BLUE SHIELD | Source: Ambulatory Visit | Attending: Neurosurgery | Admitting: Neurosurgery

## 2016-11-13 DIAGNOSIS — M8938 Hypertrophy of bone, other site: Secondary | ICD-10-CM | POA: Insufficient documentation

## 2016-11-13 DIAGNOSIS — M4802 Spinal stenosis, cervical region: Secondary | ICD-10-CM | POA: Diagnosis not present

## 2016-11-13 DIAGNOSIS — M4312 Spondylolisthesis, cervical region: Secondary | ICD-10-CM | POA: Diagnosis not present

## 2016-11-13 DIAGNOSIS — M542 Cervicalgia: Secondary | ICD-10-CM | POA: Diagnosis not present

## 2016-11-13 DIAGNOSIS — M50222 Other cervical disc displacement at C5-C6 level: Secondary | ICD-10-CM | POA: Insufficient documentation

## 2016-11-23 ENCOUNTER — Other Ambulatory Visit: Payer: Self-pay | Admitting: Adult Health

## 2016-12-04 DIAGNOSIS — M50222 Other cervical disc displacement at C5-C6 level: Secondary | ICD-10-CM | POA: Diagnosis not present

## 2016-12-24 ENCOUNTER — Other Ambulatory Visit: Payer: BLUE CROSS/BLUE SHIELD | Admitting: Adult Health

## 2017-01-01 ENCOUNTER — Ambulatory Visit (INDEPENDENT_AMBULATORY_CARE_PROVIDER_SITE_OTHER): Payer: BLUE CROSS/BLUE SHIELD | Admitting: Adult Health

## 2017-01-01 ENCOUNTER — Encounter: Payer: Self-pay | Admitting: Adult Health

## 2017-01-01 VITALS — BP 120/68 | HR 84 | Ht 65.25 in | Wt 169.0 lb

## 2017-01-01 DIAGNOSIS — K649 Unspecified hemorrhoids: Secondary | ICD-10-CM

## 2017-01-01 DIAGNOSIS — Z79890 Hormone replacement therapy (postmenopausal): Secondary | ICD-10-CM

## 2017-01-01 DIAGNOSIS — N898 Other specified noninflammatory disorders of vagina: Secondary | ICD-10-CM

## 2017-01-01 DIAGNOSIS — Z01419 Encounter for gynecological examination (general) (routine) without abnormal findings: Secondary | ICD-10-CM

## 2017-01-01 DIAGNOSIS — Z1211 Encounter for screening for malignant neoplasm of colon: Secondary | ICD-10-CM | POA: Diagnosis not present

## 2017-01-01 DIAGNOSIS — L298 Other pruritus: Secondary | ICD-10-CM | POA: Diagnosis not present

## 2017-01-01 DIAGNOSIS — Z01411 Encounter for gynecological examination (general) (routine) with abnormal findings: Secondary | ICD-10-CM | POA: Diagnosis not present

## 2017-01-01 DIAGNOSIS — Z1212 Encounter for screening for malignant neoplasm of rectum: Secondary | ICD-10-CM

## 2017-01-01 LAB — HEMOCCULT GUIAC POC 1CARD (OFFICE): Fecal Occult Blood, POC: NEGATIVE

## 2017-01-01 MED ORDER — CLOBETASOL PROPIONATE 0.05 % EX CREA: 1.0000 "application " | TOPICAL_CREAM | CUTANEOUS | 1 refills | Status: DC | PRN

## 2017-01-01 MED ORDER — NORETHINDRONE-ETH ESTRADIOL 1-5 MG-MCG PO TABS: 1.0000 | ORAL_TABLET | Freq: Every day | ORAL | 3 refills | Status: DC

## 2017-01-01 NOTE — Progress Notes (Signed)
Patient ID: Andrea Bailey, female   DOB: 1966-07-16, 51 y.o.   MRN: [id number] History of Present Illness: Andrea Bailey is a 51 year old white female, in for well woman gyn exam,she had a normal pap with negative HPV 11/08/15.She had neck surgery in September and boils on bottom that was MRSA after that.  PCP is Lubertha SouthSteve Luking.   Current Medications, Allergies, Past Medical History, Past Surgical History, Family History and Social History were reviewed in [facility] electronic medical record.     Review of Systems: Patient denies any headaches, hearing loss, fatigue, blurred vision, shortness of breath, chest pain, abdominal pain, problems with bowel movements, urination, or intercourse. No joint pain or mood swings.    Physical Exam:BP 120/68 (BP Location: Left Arm, Patient Position: Sitting, Cuff Size: Normal)   Pulse 84   Ht 5' 5.25" (1.657 m)   Wt 169 lb (76.7 kg)   BMI 27.91 kg/m  General:  Well developed, well nourished, no acute distress Skin:  Warm and dry Neck:  Midline trachea, normal thyroid, good ROM, no lymphadenopathy Lungs; Clear to auscultation bilaterally Breast:  No dominant palpable mass, retraction, or nipple discharge Cardiovascular: Regular rate and rhythm Abdomen:  Soft, non tender, no hepatosplenomegaly Pelvic:  External genitalia is normal in appearance, no lesions.  The vagina is normal in appearance. Urethra has no lesions or masses. The cervix is bulbous.  Uterus is felt to be normal size, shape, and contour.  No adnexal masses or tenderness noted.Bladder is non tender, no masses felt. Rectal: Good sphincter tone, no polyps, + hemorrhoids felt.  Hemoccult negative. Extremities/musculoskeletal:  No swelling or varicosities noted, no clubbing or cyanosis Psych:  No mood changes, alert and cooperative,seems happy PHQ 2 score 0  Impression:  1. Well woman exam with routine gynecological exam   2. Screening for colorectal cancer   3. Hormone replacement therapy  (HRT)   4. Hemorrhoids, unspecified hemorrhoid type   5. Itching in the vaginal area      Plan: Meds ordered this encounter  Medications  . clobetasol cream (TEMOVATE) 0.05 %    Sig: Apply 1 application topically as needed.    Dispense:  30 g    Refill:  1    Order Specific Question:   Supervising Provider    Answer:   EURE, LUTHER H [[id number]]  . norethindrone-ethinyl estradiol (FYAVOLV) 1-5 MG-MCG TABS tablet    Sig: Take 1 tablet by mouth daily.    Dispense:  84 tablet    Refill:  3    Order Specific Question:   Supervising Provider    Answer:   Duane LopeEURE, LUTHER H [[id number]]   Pap and physical in 1 year Mammogram now and yearly Colonoscopy advised  Labs at work

## 2017-01-22 ENCOUNTER — Other Ambulatory Visit: Payer: Self-pay | Admitting: Obstetrics and Gynecology

## 2017-01-22 DIAGNOSIS — Z1231 Encounter for screening mammogram for malignant neoplasm of breast: Secondary | ICD-10-CM

## 2017-02-04 ENCOUNTER — Ambulatory Visit (HOSPITAL_COMMUNITY)
Admission: RE | Admit: 2017-02-04 | Discharge: 2017-02-04 | Disposition: A | Discharge status: Home / Self Care | Payer: BLUE CROSS/BLUE SHIELD | Source: Ambulatory Visit | Attending: Obstetrics and Gynecology | Admitting: Obstetrics and Gynecology

## 2017-02-04 DIAGNOSIS — Z1231 Encounter for screening mammogram for malignant neoplasm of breast: Secondary | ICD-10-CM | POA: Diagnosis not present

## 2017-02-25 IMAGING — CT CT CERVICAL SPINE W/O CM
3 of 4 series · 12 of 33 positions shown, 14 images · non-contrast
Comparison: Postoperative radiographs 10/02/2016. Cervical spine
MRI 06/22/2016. Cervical spine radiographs 06/13/2015.

CLINICAL DATA: 50-year-old female with continued cervical spine
pain. Prior surgery most recently in [REDACTED]. Left upper extremity
tingling and numbness for 2 weeks. Initial encounter.

EXAM:
CT CERVICAL SPINE WITHOUT CONTRAST
TECHNIQUE: Multidetector CT imaging of the cervical spine was performed without
intravenous contrast. Multiplanar CT image reconstructions were also
generated.

[Series 6: sagittal bone · sagittal · 0.24mm/px · 5 of 47 slices shown, 6 images]
[im 16/47  bone]
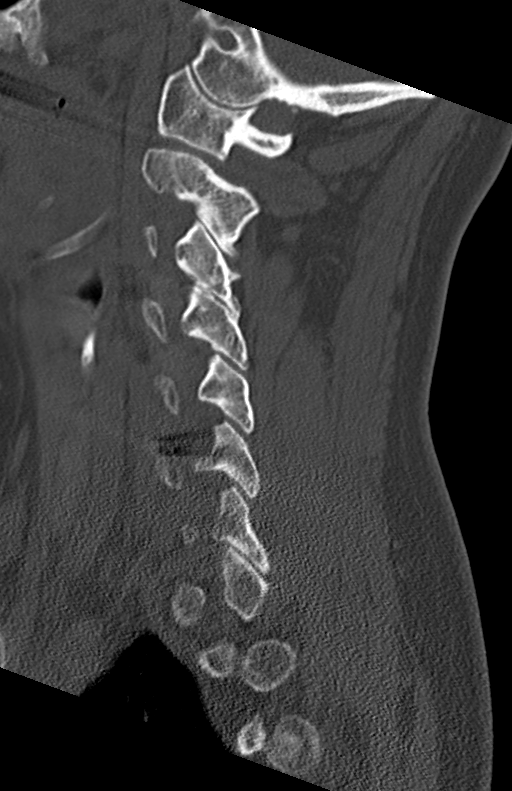
[im 20/47  bone]
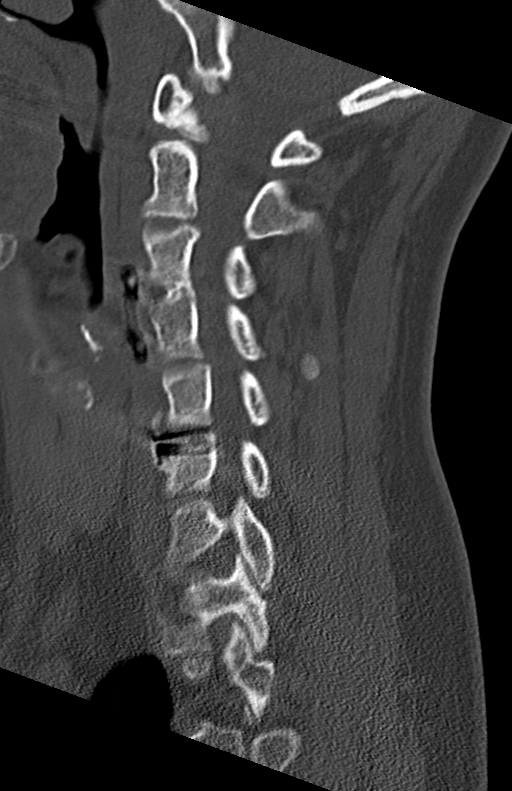
[im 24/47  soft-tissue]
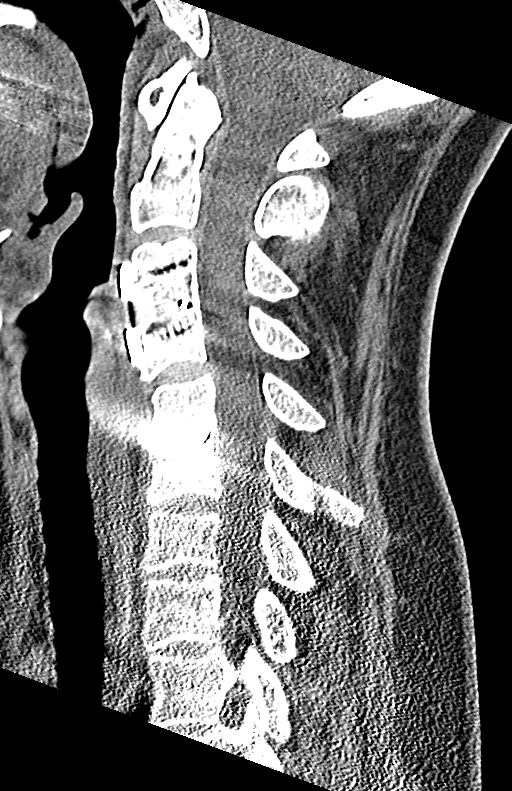
[im 24/47  bone]
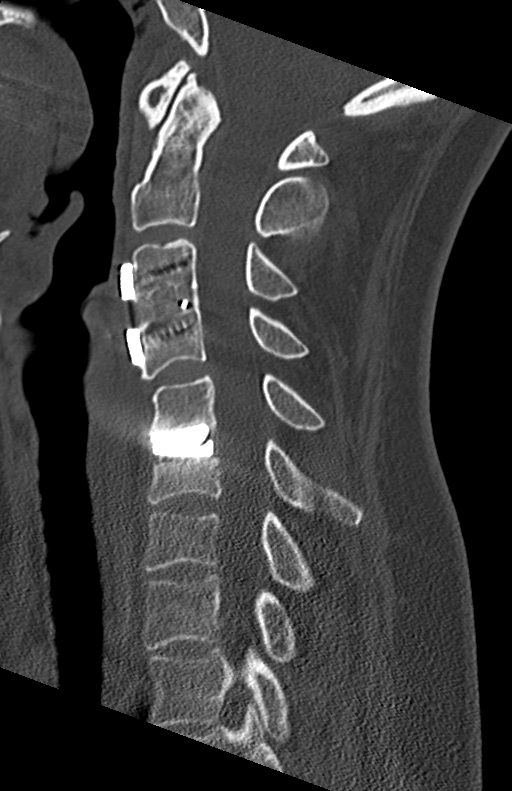
[im 27/47  bone]
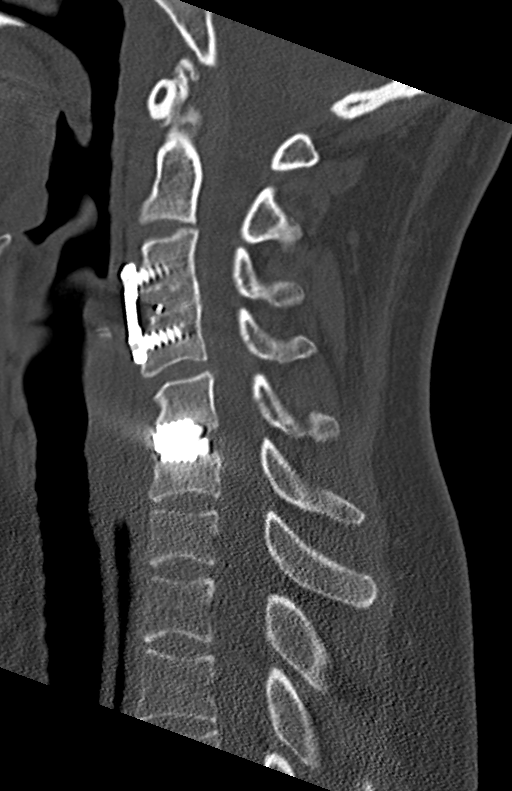
[im 31/47  bone]
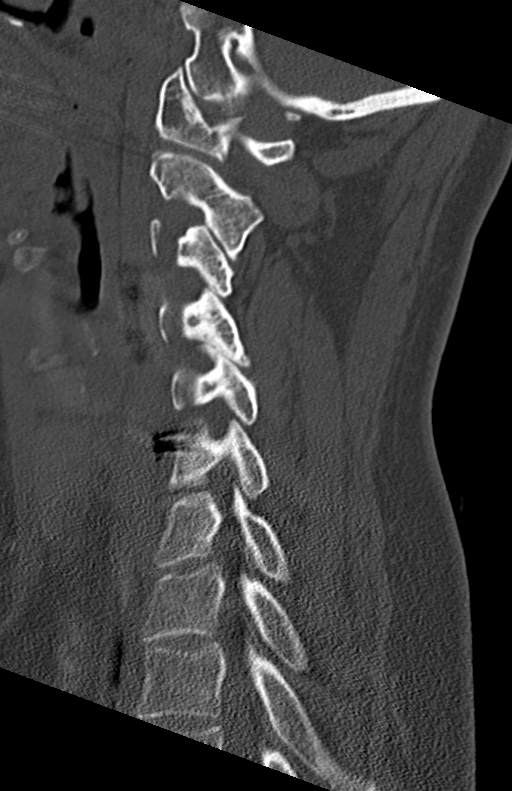

[Series 7: coronal bone · coronal · 0.24mm/px · 3 of 51 slices shown]
[im 11/51  bone]
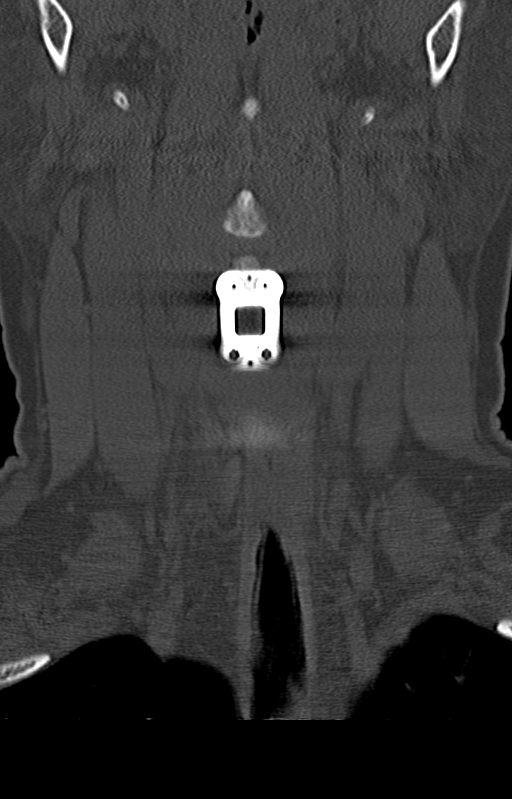
[im 21/51  bone]
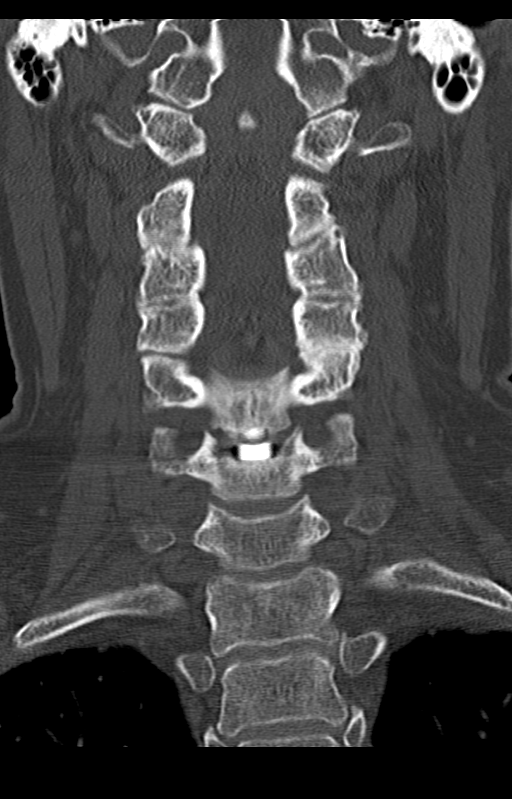
[im 31/51  bone]
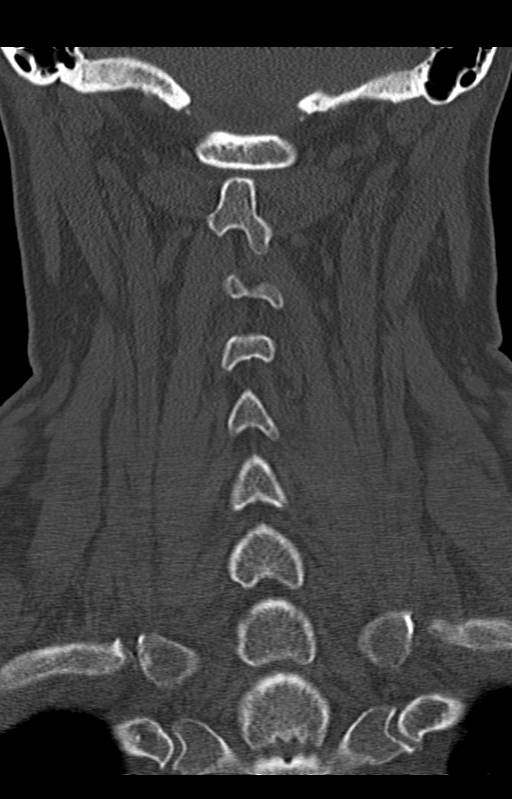

[Series 9: orthogonal bone · axial · 0.21mm/px · z∈[+92,+201]mm · 4 of 90 slices shown, 5 images]
[im 15/90  soft-tissue]
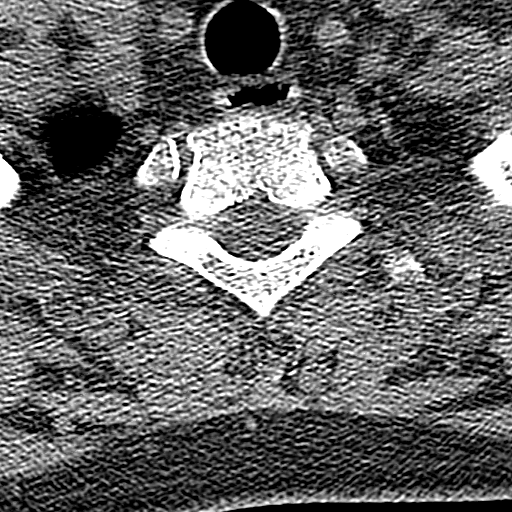
[im 15/90  bone]
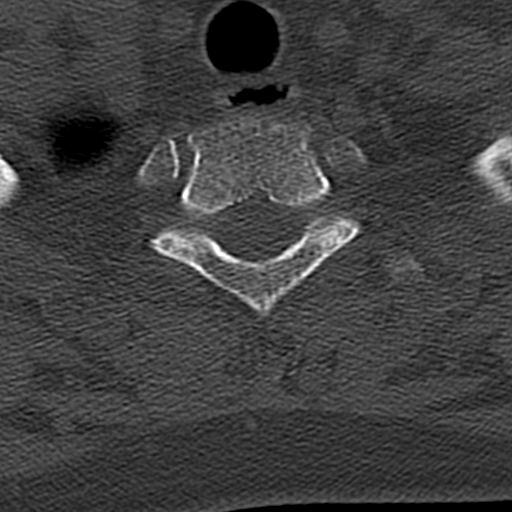
[im 30/90  bone]
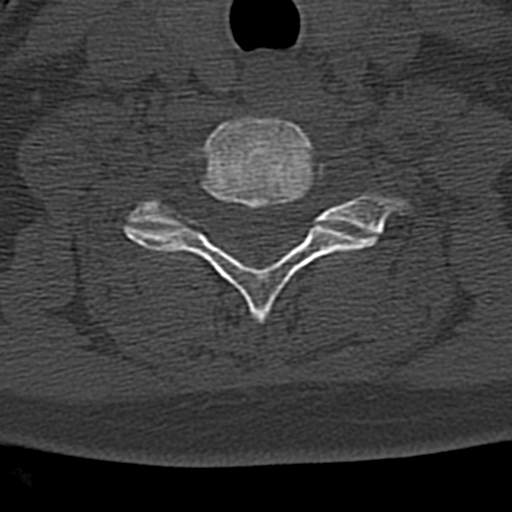
[im 60/90  bone]
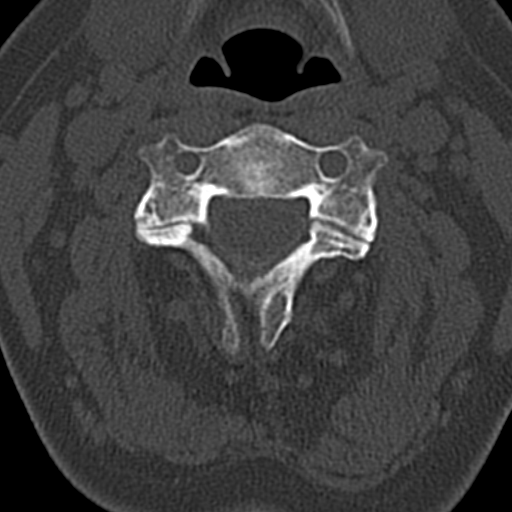
[im 75/90  bone]
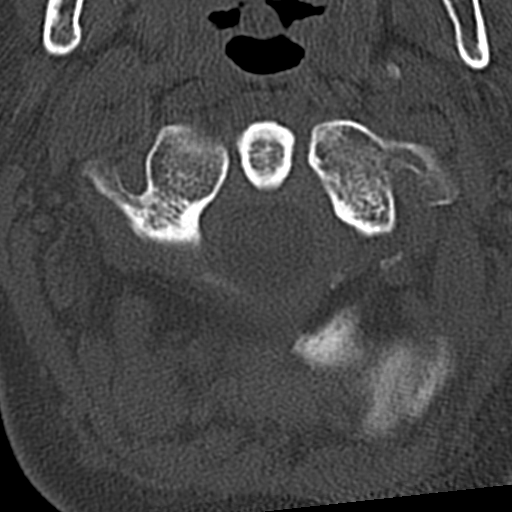

[12 of 33 positions shown; findings below may reference images not displayed]

FINDINGS: Alignment: Stable vertebral height and alignment since June 2016.
Mild reversal of lower cervical lordosis. Cervicothoracic junction
alignment is within normal limits. Bilateral posterior element
alignment is within normal limits.

Preexisting C3-C4 ACDF changes. Disc arthroplasty at C5-C6 is new
since the prior MRI.

Skull base and vertebrae: Visualized skull base is intact. No
atlanto-occipital dissociation. Visible mastoid air cells are clear.
No acute osseous abnormality identified.

Soft tissues and spinal canal: Negative noncontrast neck soft
tissues. Negative visualized posterior fossa structures.

Disc levels:
C2-C3: Mild to moderate facet and uncovertebral hypertrophy greater
on the right. Mild or at most moderate right C3 foraminal stenosis
appears stable.

C3-C4: Previous ACDF with solid interbody and right posterior
element arthrodesis.

C4-C5: Mild to moderate facet hypertrophy greater on the left.
Subtle anterolisthesis. Mild to moderate left C5 foraminal stenosis
appears stable.

C5-C6: Disc arthroplasty hardware now in place. Hardware appears
intact. No adverse features identified. No stenosis.

C6-C7: Minimal left facet and uncovertebral hypertrophy. No
stenosis.

C7-T1:  Mild facet hypertrophy.  No stenosis.

Upper chest: Negative lung apices. Visualized upper thoracic levels
are intact.
IMPRESSION: 1. Disc arthroplasty changes at C5-C6 with no adverse features.
Preexisting C3-C4 ACDF with solid arthrodesis.
2. Subtle anterolisthesis at the intervening C4-C5 segment. Stable
mild to moderate facet hypertrophy and foraminal stenosis on the
left.
3. Stable chronic facet and uncovertebral hypertrophy at C2-C3
greater on the right.

## 2017-03-05 ENCOUNTER — Ambulatory Visit (INDEPENDENT_AMBULATORY_CARE_PROVIDER_SITE_OTHER): Payer: BLUE CROSS/BLUE SHIELD | Admitting: Family Medicine

## 2017-03-05 ENCOUNTER — Encounter: Payer: Self-pay | Admitting: Family Medicine

## 2017-03-05 VITALS — BP 118/76 | Temp 98.6°F | Ht 65.25 in | Wt 162.4 lb

## 2017-03-05 DIAGNOSIS — J111 Influenza due to unidentified influenza virus with other respiratory manifestations: Secondary | ICD-10-CM

## 2017-03-05 DIAGNOSIS — J4520 Mild intermittent asthma, uncomplicated: Secondary | ICD-10-CM | POA: Diagnosis not present

## 2017-03-05 MED ORDER — HYDROCODONE-HOMATROPINE 5-1.5 MG/5ML PO SYRP: 5.0000 mL | ORAL_SOLUTION | Freq: Every evening | ORAL | 0 refills | Status: DC | PRN

## 2017-03-05 MED ORDER — AMOXICILLIN-POT CLAVULANATE 875-125 MG PO TABS: 1.0000 | ORAL_TABLET | Freq: Two times a day (BID) | ORAL | 0 refills | Status: AC

## 2017-03-05 MED ORDER — ALBUTEROL SULFATE HFA 108 (90 BASE) MCG/ACT IN AERS: 2.0000 | INHALATION_SPRAY | Freq: Four times a day (QID) | RESPIRATORY_TRACT | 0 refills | Status: DC | PRN

## 2017-03-05 NOTE — Progress Notes (Signed)
   Subjective:    Patient ID: [name], female    DOB: Sep 24, 1966, 51 y.o.   MRN: [id number]  Cough  This is a new problem. The current episode started in the past 7 days. Associated symptoms include ear pain, headaches, nasal congestion and a sore throat. Treatments tried: nyquil, dayquil and robitussin.   Very bad cough   Very bad headache  No sig fever  No energy  No appetite,   No prod cough   Sharp shooting pains   Pos achey in muscls and joints   Had eye irrit transiently   Slight irrit, and painfu,     Review of Systems  HENT: Positive for ear pain and sore throat.   Respiratory: Positive for cough.   Neurological: Positive for headaches.       Objective:   Physical Exam  Alert vitals reviewed, moderate malaise. Hydration good. Positive nasal congestion lungs no crackles , However positive for wheezes, no tachypnea, intermittent bronchial cough during exam heart regular rate and rhythm.       Assessment & Plan:  Impression influenza discussed at length. Ashby Dawes of illness and potential sequela discussed. Plan Tamiflu prescribed if indicated and timing appropriate. Antibiotics prescribed for secondary rhinosinusitis/bronchitis. Albuterol prescribed proper use discussed. Hycodan daily at bedtime when necessary for cough Symptom care discussed. Warning signs discussed. WSL

## 2017-09-04 ENCOUNTER — Encounter: Payer: Self-pay | Admitting: Family Medicine

## 2017-09-04 ENCOUNTER — Ambulatory Visit (INDEPENDENT_AMBULATORY_CARE_PROVIDER_SITE_OTHER): Payer: BLUE CROSS/BLUE SHIELD | Admitting: Family Medicine

## 2017-09-04 VITALS — BP 128/82 | Ht 65.25 in | Wt 162.8 lb

## 2017-09-04 DIAGNOSIS — M542 Cervicalgia: Secondary | ICD-10-CM | POA: Diagnosis not present

## 2017-09-04 DIAGNOSIS — G8929 Other chronic pain: Secondary | ICD-10-CM

## 2017-09-04 MED ORDER — DICLOFENAC SODIUM 75 MG PO TBEC: 75.0000 mg | DELAYED_RELEASE_TABLET | Freq: Two times a day (BID) | ORAL | 0 refills | Status: DC

## 2017-09-04 NOTE — Progress Notes (Signed)
   Subjective:    Patient ID: Andrea Bailey, female    DOB: 03/31/1966, 51 y.o.   MRN: [id number]  HPI  Patient arrives with c/o ongoing neck pain since having ans artificial disc put in last year.  Pt had artiicial disc placed in neck ast sept  Had ct scan done  neurosurg told pt there wa nothing he can do  Pain has never stopped  Waked up with severe pain even when turning the neck   Wonders if the disc is working like it should, had a fusion , before, and oanothr in the neck, those surgeries were overal successful   Different level this go around now ten mo out and very frustrated about the pain   couplewks ago tried to see a dr Peggye Form and was told needed ref    Review of Systems No headache, no major weight loss or weight gain, no chest pain no back pain abdominal pain no change in bowel habits complete ROS otherwise negative     Objective:   Physical Exam  Alert and oriented, vitals reviewed and stable, NAD ENT-TM's and ext canals WNL bilat via otoscopic exam Soft palate, tonsils and post pharynx WNL via oropharyngeal exam Neck-symmetric, no masses; thyroid nonpalpable and nontender Pulmonary-no tachypnea or accessory muscle use; Clear without wheezes via auscultation Card--no abnrml murmurs, rhythm reg and rate WNL Carotid pulses symmetric, without bruits Neck positive pain with lateral rotation both directions forward flexion and extension. Distal strength sensation in arms and hands appear intact.      Assessment & Plan:  Impression worsening chronic cervical pain. 9 months status post artificial disc insertion. Pain localized right adnexa therefore likely not neuropathic but probably inflammatory in nature. Discussed at length.doubt that neurosurgeon would be helpful at this time. Rationale discussed. Patient would like to see Dr. Peggye Form"

## 2017-09-09 ENCOUNTER — Encounter: Payer: Self-pay | Admitting: Family Medicine

## 2017-09-20 ENCOUNTER — Other Ambulatory Visit: Payer: Self-pay | Admitting: Adult Health

## 2017-10-01 ENCOUNTER — Other Ambulatory Visit: Payer: Self-pay | Admitting: *Deleted

## 2017-10-01 MED ORDER — DICLOFENAC SODIUM 75 MG PO TBEC: 75.0000 mg | DELAYED_RELEASE_TABLET | Freq: Two times a day (BID) | ORAL | 0 refills | Status: DC

## 2017-12-19 DIAGNOSIS — S0500XA Injury of conjunctiva and corneal abrasion without foreign body, unspecified eye, initial encounter: Secondary | ICD-10-CM | POA: Diagnosis not present

## 2018-02-05 DIAGNOSIS — M47812 Spondylosis without myelopathy or radiculopathy, cervical region: Secondary | ICD-10-CM | POA: Diagnosis not present

## 2018-02-05 DIAGNOSIS — Z6827 Body mass index (BMI) 27.0-27.9, adult: Secondary | ICD-10-CM | POA: Diagnosis not present

## 2018-02-05 DIAGNOSIS — R030 Elevated blood-pressure reading, without diagnosis of hypertension: Secondary | ICD-10-CM | POA: Diagnosis not present

## 2018-02-06 ENCOUNTER — Other Ambulatory Visit: Payer: Self-pay | Admitting: Adult Health

## 2018-02-17 ENCOUNTER — Ambulatory Visit: Payer: BLUE CROSS/BLUE SHIELD | Admitting: Adult Health

## 2018-02-17 ENCOUNTER — Other Ambulatory Visit (HOSPITAL_COMMUNITY)
Admission: RE | Admit: 2018-02-17 | Discharge: 2018-02-17 | Disposition: A | Discharge status: Home / Self Care | Payer: BLUE CROSS/BLUE SHIELD | Source: Ambulatory Visit | Attending: Adult Health | Admitting: Adult Health

## 2018-02-17 ENCOUNTER — Encounter: Payer: Self-pay | Admitting: Adult Health

## 2018-02-17 VITALS — BP 120/80 | HR 95 | Ht 66.0 in | Wt 167.4 lb

## 2018-02-17 DIAGNOSIS — Z01419 Encounter for gynecological examination (general) (routine) without abnormal findings: Secondary | ICD-10-CM

## 2018-02-17 DIAGNOSIS — Z1211 Encounter for screening for malignant neoplasm of colon: Secondary | ICD-10-CM

## 2018-02-17 DIAGNOSIS — L0291 Cutaneous abscess, unspecified: Secondary | ICD-10-CM | POA: Insufficient documentation

## 2018-02-17 DIAGNOSIS — Z1212 Encounter for screening for malignant neoplasm of rectum: Secondary | ICD-10-CM

## 2018-02-17 DIAGNOSIS — L918 Other hypertrophic disorders of the skin: Secondary | ICD-10-CM | POA: Diagnosis not present

## 2018-02-17 DIAGNOSIS — Z79890 Hormone replacement therapy (postmenopausal): Secondary | ICD-10-CM | POA: Diagnosis not present

## 2018-02-17 DIAGNOSIS — Z01411 Encounter for gynecological examination (general) (routine) with abnormal findings: Secondary | ICD-10-CM

## 2018-02-17 DIAGNOSIS — K649 Unspecified hemorrhoids: Secondary | ICD-10-CM | POA: Diagnosis not present

## 2018-02-17 LAB — HEMOCCULT GUIAC POC 1CARD (OFFICE): FECAL OCCULT BLD: NEGATIVE

## 2018-02-17 MED ORDER — SULFAMETHOXAZOLE-TRIMETHOPRIM 800-160 MG PO TABS: 1.0000 | ORAL_TABLET | Freq: Two times a day (BID) | ORAL | 0 refills | Status: DC

## 2018-02-17 MED ORDER — VALACYCLOVIR HCL 1 G PO TABS: 1000.0000 mg | ORAL_TABLET | Freq: Every day | ORAL | 2 refills | Status: DC

## 2018-02-17 MED ORDER — NORETHINDRONE-ETH ESTRADIOL 1-5 MG-MCG PO TABS: 1.0000 | ORAL_TABLET | Freq: Every day | ORAL | 3 refills | Status: DC

## 2018-02-17 MED ORDER — CLOBETASOL PROPIONATE 0.05 % EX CREA: 1.0000 "application " | TOPICAL_CREAM | CUTANEOUS | 1 refills | Status: DC | PRN

## 2018-02-17 NOTE — Progress Notes (Signed)
Patient ID: Andrea Bailey, female   DOB: Jul 28, 1966, 52 y.o.   MRN: [id number] History of Present Illness:  Andrea Bailey is a 52 year old white female in for well woman gyn exam and pap.She had labs and work and brought a copy, looks good, will scan.   Current Medications, Allergies, Past Medical History, Past Surgical History, Family History and Social History were reviewed in [name] record.     Review of Systems: Patient denies any headaches, hearing loss, fatigue, blurred vision, shortness of breath, chest pain, abdominal pain, problems with bowel movements, urination, or intercourse. No joint pain or mood swings.    Physical Exam:BP 120/80 (BP Location: Right Arm, Patient Position: Sitting, Cuff Size: Small)   Pulse 95   Ht 5\' 6"  (1.676 m)   Wt 167 lb 6.4 oz (75.9 kg)   BMI 27.02 kg/m  General:  Well developed, well nourished, no acute distress Skin:  Warm and dry Neck:  Midline trachea, normal thyroid, good ROM, no lymphadenopathy Lungs; Clear to auscultation bilaterally Breast:  No dominant palpable mass, retraction, or nipple discharge Cardiovascular: Regular rate and rhythm Abdomen:  Soft, non tender, no hepatosplenomegaly Pelvic:  External genitalia is normal in appearance, has skin tag right of clit area. Has abscess right inner groin area, with scarring around it, draining clear fluid. The vagina is normal in appearance. Urethra has no lesions or masses. The cervix is bulbous.Pap with HPV performed.  Uterus is felt to be normal size, shape, and contour.  No adnexal masses or tenderness noted.Bladder is non tender, no masses felt. Rectal: Good sphincter tone, no polyps, + hemorrhoids felt.  Hemoccult negative.Skin tag near left anal area Extremities/musculoskeletal:  No swelling or varicosities noted, no clubbing or cyanosis Psych:  No mood changes, alert and cooperative,seems happy PHQ 9 score 2.Will rx septra ds and get back in 2 weeks for excision of skin  tags and abscess.   Impression: 1. Encounter for gynecological examination with Papanicolaou smear of cervix   2. Hemorrhoids, unspecified hemorrhoid type   3. Screening for colorectal cancer   4. Hormone replacement therapy (HRT)   5. Abscess   6. Skin tags, multiple acquired       Plan:  Meds ordered this encounter  Medications  . norethindrone-ethinyl estradiol (FYAVOLV) 1-5 MG-MCG TABS tablet    Sig: Take 1 tablet by mouth daily.    Dispense:  84 tablet    Refill:  3    Order Specific Question:   Supervising Provider    Answer:   Despina Hidden, LUTHER H [[id number]]  . valACYclovir (VALTREX) 1000 MG tablet    Sig: Take 1 tablet (1,000 mg total) by mouth daily.    Dispense:  90 tablet    Refill:  2    Order Specific Question:   Supervising Provider    Answer:   Despina Hidden, LUTHER H [[id number]]  . clobetasol cream (TEMOVATE) 0.05 %    Sig: Apply 1 application topically as needed.    Dispense:  30 g    Refill:  1    Order Specific Question:   Supervising Provider    Answer:   EURE, LUTHER H [[id number]]  . DISCONTD: sulfamethoxazole-trimethoprim (BACTRIM DS,SEPTRA DS) 800-160 MG tablet    Sig: Take 1 tablet by mouth 2 (two) times daily. Take 1 bid    Dispense:  14 tablet    Refill:  0    Order Specific Question:   Supervising Provider    Answer:  EURE, LUTHER H [[id number]]  . sulfamethoxazole-trimethoprim (BACTRIM DS,SEPTRA DS) 800-160 MG tablet    Sig: Take 1 tablet by mouth 2 (two) times daily. Take 1 bid    Dispense:  28 tablet    Refill:  0    Order Specific Question:   Supervising Provider    Answer:   Duane LopeEURE, LUTHER H [[id number]]   F/U in 2 weeks for excision with Dr Emelda FearFerguson  Physical in 1 year  Pap in 3 if normal Mammogram yearly

## 2018-02-18 LAB — CYTOLOGY - PAP
DIAGNOSIS: NEGATIVE
HPV (WINDOPATH): NOT DETECTED

## 2018-03-05 ENCOUNTER — Encounter: Payer: Self-pay | Admitting: Obstetrics and Gynecology

## 2018-03-05 ENCOUNTER — Other Ambulatory Visit: Payer: Self-pay | Admitting: Obstetrics and Gynecology

## 2018-03-05 ENCOUNTER — Ambulatory Visit: Payer: BLUE CROSS/BLUE SHIELD | Admitting: Obstetrics and Gynecology

## 2018-03-05 VITALS — BP 114/62 | HR 74 | Ht 66.0 in | Wt 166.0 lb

## 2018-03-05 DIAGNOSIS — L723 Sebaceous cyst: Secondary | ICD-10-CM

## 2018-03-05 DIAGNOSIS — A630 Anogenital (venereal) warts: Secondary | ICD-10-CM | POA: Diagnosis not present

## 2018-03-05 DIAGNOSIS — D649 Anemia, unspecified: Secondary | ICD-10-CM

## 2018-03-05 DIAGNOSIS — L918 Other hypertrophic disorders of the skin: Secondary | ICD-10-CM

## 2018-03-05 LAB — POCT HEMOGLOBIN: Hemoglobin: 9.7 g/dL — AB (ref 12.2–16.2)

## 2018-03-05 MED ORDER — CEPHALEXIN 500 MG PO CAPS: 500.0000 mg | ORAL_CAPSULE | Freq: Four times a day (QID) | ORAL | 0 refills | Status: DC

## 2018-03-05 NOTE — Progress Notes (Signed)
PROCEDURE NOTE  PRE-OP DIAGNOSIS:  condyloma  PROCEDURE:  Skin Lesion Excision(s)  INDICATIONS:  Janene HarveyCindy L Delancey is a 52 y.o. female who presents for minor skin surgery.  The patient understands all risks, benefits, indications, potential complications, and alternatives, and freely consents for the procedure.  The patient also understands the option of performing no surgery, the risk for scarring, and the technique of the procedure. There are three sites all separate,  1. supraclitoral condyloma 2. Perianal condyloma 3  Vulvar chronic folliculitis right thigh below thigh crease ANESTHESIA:  Local.  TECHNIQUE:  After informed consent was obtained, and after the skin was prepped and draped, 1% lidocaine without epinephrine for anesthetic was injected around and underneath the sites, 10 cc total .   shave excision was performed.  A dressing was applied and wound care instructions were provided.  Ezrah tolerated the procedure well and without complications.  The patient will be alert for any signs of cutaneous infection and will follow up as instructed. Rx Keflex 500 qid x q wk.  Suture removal 1 wk

## 2018-03-05 NOTE — Progress Notes (Signed)
Patient ID: Andrea Bailey, female   DOB: [birth year], 52 y.o.   MRN: [id number]    [facility]  @DATE @            Patient name: Andrea Bailey MRN [id number]  Date of birth: [birth year]  CC & HPI:  Andrea Bailey is a 52 y.o. female presenting today for the removal of two skin tags and an excision of an abscess. Andrea GarterJennifer Griffen, NP prescribed her antibiotics at her last visit on 02/17/2018. She denies fever, chills or any other symptoms or complaints at this time.   ROS:  ROS +skin tags +abscess -fever -chills All systems are negative except as noted in the HPI and PMH.   Pertinent History Reviewed:   Reviewed: Significant for MRSA Medical         Past Medical History:  Diagnosis Date  . Back pain    buldging pain  . Contraceptive management 10/26/2013  . Hematoma 1994   after birth of second child  . Hemorrhoids   . Hemorrhoids 10/26/2013  . Herpes simplex without mention of complication   . History of herpes simplex type 2 infection 10/26/2013  . Hormone replacement therapy (HRT) 11/08/2015  . Itching in the vaginal area 10/26/2013  . Leg cramps 10/26/2013  . MRSA (methicillin resistant Staphylococcus aureus)   . URI (upper respiratory infection) 11/02/2014                              Surgical Hx:    Past Surgical History:  Procedure Laterality Date  . CESAREAN SECTION  1998   along with tubal ligation  . CHOLECYSTECTOMY  several yrs ago  . COLONOSCOPY  09/05/2011   Procedure: COLONOSCOPY;  Surgeon: Arlyce HarmanSandi M Fields, MD;  Location: [facility];  Service: Endoscopy;  Laterality: N/A;  11:30  . ENDOMETRIAL ABLATION  several yrs ago  . LUMBAR LAMINECTOMY/DECOMPRESSION MICRODISCECTOMY  12/28/2011   Procedure: LUMBAR LAMINECTOMY/DECOMPRESSION MICRODISCECTOMY;  Surgeon: Reinaldo Meekerandy O Kritzer, MD;  Location: [facility];  Service: Neurosurgery;  Laterality: Right;  LUMBAR LAMINECTOMY DECOMPRESSION MICRODISCECTOMY LUMBAR 5-SACRAL ONE  . NECK SURGERY  2011  . NECK SURGERY   08/2016   Medications: Reviewed & Updated - see associated section                       Current Outpatient Medications:  .  norethindrone-ethinyl estradiol (FYAVOLV) 1-5 MG-MCG TABS tablet, Take 1 tablet by mouth daily., Disp: 84 tablet, Rfl: 3 .  valACYclovir (VALTREX) 1000 MG tablet, Take 1 tablet (1,000 mg total) by mouth daily., Disp: 90 tablet, Rfl: 2 .  albuterol (PROVENTIL HFA;VENTOLIN HFA) 108 (90 Base) MCG/ACT inhaler, Inhale 2 puffs into the lungs every 6 (six) hours as needed for wheezing or shortness of breath. (Patient not taking: Reported on 02/17/2018), Disp: 1 Inhaler, Rfl: 0 .  clobetasol cream (TEMOVATE) 0.05 %, Apply 1 application topically as needed. (Patient not taking: Reported on 03/05/2018), Disp: 30 g, Rfl: 1 .  diclofenac (VOLTAREN) 75 MG EC tablet, Take 1 tablet (75 mg total) by mouth 2 (two) times daily. (Patient not taking: Reported on 02/17/2018), Disp: 60 tablet, Rfl: 0 .  sulfamethoxazole-trimethoprim (BACTRIM DS,SEPTRA DS) 800-160 MG tablet, Take 1 tablet by mouth 2 (two) times daily. Take 1 bid (Patient not taking: Reported on 03/05/2018), Disp: 28 tablet, Rfl: 0   Social History: Reviewed -  reports that she has been  smoking cigarettes.  She has a 9.00 pack-year smoking history. She has never used smokeless tobacco.  Objective Findings:  Vitals: Blood pressure 114/62, pulse 74, height 5\' 6"  (1.676 m), weight 166 lb (75.3 kg).  PHYSICAL EXAMINATION General appearance - alert, well appearing, and in no distress and oriented to person, place, and time Mental status - alert, oriented to person, place, and time, normal mood, behavior, speech, dress, motor activity, and thought processes, affect appropriate to mood  PELVIC External genitalia - See below condyloma Vagina - Deferred Cervix - Deferred  Uterus - Deferred   PROCEDURE See note above   Assessment & Plan:   A:  1. Perianal condyloma 2. Periciterial condyloma 3. Right thigh infected cyst  P:   1. Rx Keflex 2. F/u 7-10 days for removal of stitches   By signing my name below, I, Diona Browner, attest that this documentation has been prepared under the direction and in the presence of Tilda Burrow, MD. Electronically Signed: Diona Browner, Medical Scribe. 03/05/18. 3:45 PM.  I personally performed the services described in this documentation, which was SCRIBED in my presence. The recorded information has been reviewed and considered accurate. It has been edited as necessary during review. Tilda Burrow, MD

## 2018-03-17 ENCOUNTER — Ambulatory Visit: Payer: BLUE CROSS/BLUE SHIELD | Admitting: Obstetrics and Gynecology

## 2018-03-17 ENCOUNTER — Encounter: Payer: Self-pay | Admitting: Obstetrics and Gynecology

## 2018-03-17 VITALS — BP 122/60 | HR 77 | Ht 66.0 in | Wt 169.0 lb

## 2018-03-17 DIAGNOSIS — O98319 Other infections with a predominantly sexual mode of transmission complicating pregnancy, unspecified trimester: Principal | ICD-10-CM

## 2018-03-17 DIAGNOSIS — L02415 Cutaneous abscess of right lower limb: Secondary | ICD-10-CM

## 2018-03-17 DIAGNOSIS — M4722 Other spondylosis with radiculopathy, cervical region: Secondary | ICD-10-CM | POA: Diagnosis not present

## 2018-03-17 DIAGNOSIS — Z4802 Encounter for removal of sutures: Secondary | ICD-10-CM | POA: Diagnosis not present

## 2018-03-17 DIAGNOSIS — A630 Anogenital (venereal) warts: Secondary | ICD-10-CM

## 2018-03-17 DIAGNOSIS — G894 Chronic pain syndrome: Secondary | ICD-10-CM | POA: Diagnosis not present

## 2018-03-17 DIAGNOSIS — M545 Low back pain: Secondary | ICD-10-CM | POA: Diagnosis not present

## 2018-03-17 DIAGNOSIS — M542 Cervicalgia: Secondary | ICD-10-CM | POA: Diagnosis not present

## 2018-03-17 MED ORDER — DOXYCYCLINE HYCLATE 100 MG PO CAPS: 100.0000 mg | ORAL_CAPSULE | Freq: Two times a day (BID) | ORAL | 0 refills | Status: DC

## 2018-03-17 NOTE — Progress Notes (Signed)
[facility] Visit  03/17/2018           Patient name: Andrea Bailey MRN [id number]  Date of birth: 1966/03/20  CC & HPI:  Andrea Bailey is a 52 y.o. female presenting today for stitch removals from her excision of an abscess and removal of two skin tags on 03/05/18. She reports some drainage from her excision sites. She denies any pain, but doe note ome soreness. No other associated symptom noted. She completed her antibiotics. No alleviating factors noted. Pt has not tried any other medications.   ROS:  ROS (-) pain (+) mild drainage from site All systems are negative except as noted in the HPI and PMH.   Pertinent History Reviewed:   Reviewed: Significant for hemorrhoids, HSV, MRSA Medical         Past Medical History:  Diagnosis Date  . Back pain    buldging pain  . Contraceptive management 10/26/2013  . Hematoma 1994   after birth of second child  . Hemorrhoids   . Hemorrhoids 10/26/2013  . Herpes simplex without mention of complication   . History of herpes simplex type 2 infection 10/26/2013  . Hormone replacement therapy (HRT) 11/08/2015  . Itching in the vaginal area 10/26/2013  . Leg cramps 10/26/2013  . MRSA (methicillin resistant Staphylococcus aureus)   . URI (upper respiratory infection) 11/02/2014                              Surgical Hx:    Past Surgical History:  Procedure Laterality Date  . CESAREAN SECTION  1998   along with tubal ligation  . CHOLECYSTECTOMY  several yrs ago  . COLONOSCOPY  09/05/2011   Procedure: COLONOSCOPY;  Surgeon: Arlyce Harman, MD;  [facility];  Service: Endoscopy;  Laterality: N/A;  11:30  . ENDOMETRIAL ABLATION  several yrs ago  . LUMBAR LAMINECTOMY/DECOMPRESSION MICRODISCECTOMY  12/28/2011   Procedure: LUMBAR LAMINECTOMY/DECOMPRESSION MICRODISCECTOMY;  Surgeon: Reinaldo Meeker, MD;  Location: [facility];  Service: Neurosurgery;  Laterality: Right;  LUMBAR LAMINECTOMY DECOMPRESSION MICRODISCECTOMY LUMBAR  5-SACRAL ONE  . NECK SURGERY  2011  . NECK SURGERY  08/2016   Medications: Reviewed & Updated - see associated section                       Current Outpatient Medications:  .  clobetasol cream (TEMOVATE) 0.05 %, Apply 1 application topically as needed., Disp: 30 g, Rfl: 1 .  norethindrone-ethinyl estradiol (FYAVOLV) 1-5 MG-MCG TABS tablet, Take 1 tablet by mouth daily., Disp: 84 tablet, Rfl: 3 .  valACYclovir (VALTREX) 1000 MG tablet, Take 1 tablet (1,000 mg total) by mouth daily., Disp: 90 tablet, Rfl: 2 .  albuterol (PROVENTIL HFA;VENTOLIN HFA) 108 (90 Base) MCG/ACT inhaler, Inhale 2 puffs into the lungs every 6 (six) hours as needed for wheezing or shortness of breath. (Patient not taking: Reported on 02/17/2018), Disp: 1 Inhaler, Rfl: 0 .  cephALEXin (KEFLEX) 500 MG capsule, Take 1 capsule (500 mg total) by mouth 4 (four) times daily., Disp: 28 capsule, Rfl: 0 .  diclofenac (VOLTAREN) 75 MG EC tablet, Take 1 tablet (75 mg total) by mouth 2 (two) times daily. (Patient not taking: Reported on 02/17/2018), Disp: 60 tablet, Rfl: 0 .  sulfamethoxazole-trimethoprim (BACTRIM DS,SEPTRA DS) 800-160 MG tablet, Take 1 tablet by mouth 2 (two) times daily. Take 1 bid (Patient not taking: Reported  on 03/05/2018), Disp: 28 tablet, Rfl: 0   Social History: Reviewed -  reports that she has been smoking cigarettes.  She has a 9.00 pack-year smoking history. She has never used smokeless tobacco.  Objective Findings:  Vitals: Blood pressure 122/60, pulse 77, height 5\' 6"  (1.676 m), weight 169 lb (76.7 kg).  PHYSICAL EXAMINATION General appearance - alert, well appearing, and in no distress and oriented to person, place, and time Mental status - alert, oriented to person, place, and time, normal mood, behavior, speech, dress, motor activity, and thought processes  PELVIC External genitalia - perianal and peri-clitoral biopsy sites well healed, stitches removed from right thigh, 2 mm area between stitches  appear inflamed,  No expressible purulence, but firm deep tiddue .1 cm max diameter site.     Assessment & Plan:   A: 1. Vulvar condyloma, well healed      2. Right thigh folliculitis, some residual inflammation]      3. Hx MRSA of thigh 1.  Stitches removed  P:  1. Switch to Doxycylcine Abx for 1 week  1. F/u PRN 3 if it recurs, would leave open and let it heal by secondary intention   By signing my name below, I, Izna Ahmed, attest that this documentation has been prepared under the direction and in the presence of Tilda BurrowFerguson, Carianne Taira V, MD. Electronically Signed: Redge GainerIzna Ahmed, Medical Scribe. 03/17/18. 1:34 PM.  I personally performed the services described in this documentation, which was SCRIBED in my presence. The recorded information has been reviewed and considered accurate. It has been edited as necessary during review. Tilda BurrowJohn V Jru Pense, MD

## 2018-03-19 ENCOUNTER — Ambulatory Visit (HOSPITAL_COMMUNITY)
Admission: RE | Admit: 2018-03-19 | Discharge: 2018-03-19 | Disposition: A | Discharge status: Home / Self Care | Payer: BLUE CROSS/BLUE SHIELD | Source: Ambulatory Visit | Attending: Neurology | Admitting: Neurology

## 2018-03-19 ENCOUNTER — Other Ambulatory Visit: Payer: Self-pay | Admitting: Neurology

## 2018-03-19 DIAGNOSIS — R52 Pain, unspecified: Secondary | ICD-10-CM | POA: Insufficient documentation

## 2018-03-19 DIAGNOSIS — M542 Cervicalgia: Secondary | ICD-10-CM | POA: Diagnosis not present

## 2018-03-19 DIAGNOSIS — Z9889 Other specified postprocedural states: Secondary | ICD-10-CM | POA: Diagnosis not present

## 2018-04-03 ENCOUNTER — Ambulatory Visit: Payer: BLUE CROSS/BLUE SHIELD | Admitting: Obstetrics and Gynecology

## 2018-04-03 ENCOUNTER — Encounter: Payer: Self-pay | Admitting: Obstetrics and Gynecology

## 2018-04-03 VITALS — BP 130/80 | HR 85 | Ht 66.0 in | Wt 168.6 lb

## 2018-04-03 DIAGNOSIS — N764 Abscess of vulva: Secondary | ICD-10-CM | POA: Diagnosis not present

## 2018-04-03 MED ORDER — DOXYCYCLINE HYCLATE 100 MG PO CAPS: 100.0000 mg | ORAL_CAPSULE | Freq: Two times a day (BID) | ORAL | 1 refills | Status: DC

## 2018-04-03 MED ORDER — HYDROCODONE-ACETAMINOPHEN 5-325 MG PO TABS: 1.0000 | ORAL_TABLET | Freq: Four times a day (QID) | ORAL | 0 refills | Status: DC | PRN

## 2018-04-03 NOTE — Progress Notes (Signed)
Patient ID: [name], female   DOB: 1965-12-23, 52 y.o.   MRN: [id number]   [facility] Clinic Visit  @            Patient name: Andrea Bailey MRN [id number]  Date of birth: 30-Mar-1966  CC & HPI:  Andrea Bailey is a 52 y.o. female presenting today for a reoccurring boil after an attempt at having it removed on 03/05/2018. She was prescribed antibiotics, which she is no longer on. She noticed this reoccurrence around Monday 03/31/2018, stating she noticed to sites. The patient denies fever, chills or any other symptoms or complaints at this time.  She now has a second draining site in the right gluteal crease approximately 1.5 cm from the original site, these are likely linked in the deep tissues  ROS:  ROS +chronic folliculitis  -fever -chills All systems are negative except as noted in the HPI and PMH.   Pertinent History Reviewed:   Reviewed: Significant for herpes, MRSA, endometrial ablation Medical         Past Medical History:  Diagnosis Date  . Back pain    buldging pain  . Contraceptive management 10/26/2013  . Hematoma 1994   after birth of second child  . Hemorrhoids   . Hemorrhoids 10/26/2013  . Herpes simplex without mention of complication   . History of herpes simplex type 2 infection 10/26/2013  . Hormone replacement therapy (HRT) 11/08/2015  . Itching in the vaginal area 10/26/2013  . Leg cramps 10/26/2013  . MRSA (methicillin resistant Staphylococcus aureus)   . URI (upper respiratory infection) 11/02/2014                              Surgical Hx:    Past Surgical History:  Procedure Laterality Date  . CESAREAN SECTION  1998   along with tubal ligation  . CHOLECYSTECTOMY  several yrs ago  . COLONOSCOPY  09/05/2011   Procedure: COLONOSCOPY;  Surgeon: Arlyce Harman, MD;  [facility];  Service: Endoscopy;  Laterality: N/A;  11:30  . ENDOMETRIAL ABLATION  several yrs ago  . LUMBAR LAMINECTOMY/DECOMPRESSION MICRODISCECTOMY  12/28/2011    Procedure: LUMBAR LAMINECTOMY/DECOMPRESSION MICRODISCECTOMY;  Surgeon: Reinaldo Meeker, MD;  Location: [facility];  Service: Neurosurgery;  Laterality: Right;  LUMBAR LAMINECTOMY DECOMPRESSION MICRODISCECTOMY LUMBAR 5-SACRAL ONE  . NECK SURGERY  2011  . NECK SURGERY  08/2016   Medications: Reviewed & Updated - see associated section                       Current Outpatient Medications:  .  clobetasol cream (TEMOVATE) 0.05 %, Apply 1 application topically as needed., Disp: 30 g, Rfl: 1 .  gabapentin (NEURONTIN) 100 MG capsule, takes 300 mg at bedtime, Disp: , Rfl: 0 .  norethindrone-ethinyl estradiol (FYAVOLV) 1-5 MG-MCG TABS tablet, Take 1 tablet by mouth daily., Disp: 84 tablet, Rfl: 3 .  valACYclovir (VALTREX) 1000 MG tablet, Take 1 tablet (1,000 mg total) by mouth daily., Disp: 90 tablet, Rfl: 2   Social History: Reviewed -  reports that she has been smoking cigarettes.  She has a 9.00 pack-year smoking history. She has never used smokeless tobacco.  Objective Findings:  Vitals: Blood pressure 130/80, pulse 85, height  (1.676 m), weight 168 lb 9.6 oz (76.5 kg).  PHYSICAL EXAMINATION General appearance - alert, well appearing, and in no distress and oriented  to person, place, and time Mental status - alert, oriented to person, place, and time, normal mood, behavior, speech, dress, motor activity, and thought processes, affect appropriate to mood Skin - two drain sites 1.5 cm apart on right upper thigh   PELVIC External genitalia -3 mm draining sinus in the right gluteal crease, and distal to it 1.5 cm is a 3 mm draining site where the prior excisional effort was made.  Procedure consent was obtained verbally and subsequently confirmed in writing 10cc of 1% lidocaine was infiltrated around the sinuses, and then the sinus tracts probe.  It was apparent that there was some drainage from both sinuses as we infiltrated.  A probe was then used to cannulate the sinus to a depth of  approximately 1 to 1.2 cm and we found a communication between the 2.  A hemostat was passed through the communication and then iodoform wick placed through and through from one site to the other and left long on both sides.  Patient is That she can have a relative remove it in 3 days if not she will come back to the office in 4 days for its removal.    Assessment & Plan:   A:  1. Chronic folliculitis in right thigh that I tried to core out (03/05/2018), but did not succeed, suspect MRSA 2. Hx of chronic MRSA of thigh   P:  1. Rx Doxycycline twice a day for 2 weeks 2. Change dressing twice a day, remove drain Sunday 04/06/2018 3. F/u 2 weeks   By signing my name below, I, Diona Browner, attest that this documentation has been prepared under the direction and in the presence of Tilda Burrow, MD. Electronically Signed: Diona Browner, Medical Scribe. 04/03/18. 9:00 AM.  I personally performed the services described in this documentation, which was SCRIBED in my presence. The recorded information has been reviewed and considered accurate. It has been edited as necessary during review. Tilda Burrow, MD

## 2018-04-03 NOTE — Addendum Note (Signed)
Addended by: Tilda Burrow on: 04/03/2018 10:50 AM   Modules accepted: Orders

## 2018-04-11 DIAGNOSIS — M542 Cervicalgia: Secondary | ICD-10-CM | POA: Diagnosis not present

## 2018-04-11 DIAGNOSIS — M545 Low back pain: Secondary | ICD-10-CM | POA: Diagnosis not present

## 2018-04-11 DIAGNOSIS — M4722 Other spondylosis with radiculopathy, cervical region: Secondary | ICD-10-CM | POA: Diagnosis not present

## 2018-04-16 ENCOUNTER — Ambulatory Visit: Payer: BLUE CROSS/BLUE SHIELD | Admitting: Obstetrics and Gynecology

## 2018-04-21 ENCOUNTER — Encounter: Payer: Self-pay | Admitting: Obstetrics and Gynecology

## 2018-04-21 ENCOUNTER — Ambulatory Visit: Payer: BLUE CROSS/BLUE SHIELD | Admitting: Obstetrics and Gynecology

## 2018-04-21 ENCOUNTER — Other Ambulatory Visit: Payer: Self-pay

## 2018-04-21 VITALS — BP 108/64 | HR 77 | Ht 66.0 in | Wt 168.0 lb

## 2018-04-21 DIAGNOSIS — L0291 Cutaneous abscess, unspecified: Secondary | ICD-10-CM

## 2018-04-21 NOTE — Progress Notes (Signed)
[facility] Clinic Visit  @            Patient name: Andrea Bailey MRN [id number]  Date of birth: [birth year]  CC & HPI:  Andrea Bailey is a 52 y.o. female presenting today for f/u of rt thigh abscess that recurred after core excision, treated with iodoform wick and Doxycycline x 2 wk. Pain is gone and no swelling or d/c  ROS:  ROS This is a third episode of abscesses in different body parts,  Pertinent History Reviewed:   Reviewed: Significant for  Medical         Past Medical History:  Diagnosis Date  . Back pain    buldging pain  . Contraceptive management 10/26/2013  . Hematoma 1994   after birth of second child  . Hemorrhoids   . Hemorrhoids 10/26/2013  . Herpes simplex without mention of complication   . History of herpes simplex type 2 infection 10/26/2013  . Hormone replacement therapy (HRT) 11/08/2015  . Itching in the vaginal area 10/26/2013  . Leg cramps 10/26/2013  . MRSA (methicillin resistant Staphylococcus aureus)   . URI (upper respiratory infection) 11/02/2014                              Surgical Hx:    Past Surgical History:  Procedure Laterality Date  . CESAREAN SECTION  1998   along with tubal ligation  . CHOLECYSTECTOMY  several yrs ago  . COLONOSCOPY  09/05/2011   Procedure: COLONOSCOPY;  Surgeon: Arlyce Harman, MD;  [facility];  Service: Endoscopy;  Laterality: N/A;  11:30  . ENDOMETRIAL ABLATION  several yrs ago  . LUMBAR LAMINECTOMY/DECOMPRESSION MICRODISCECTOMY  12/28/2011   Procedure: LUMBAR LAMINECTOMY/DECOMPRESSION MICRODISCECTOMY;  Surgeon: Reinaldo Meeker, MD;  Location: [facility];  Service: Neurosurgery;  Laterality: Right;  LUMBAR LAMINECTOMY DECOMPRESSION MICRODISCECTOMY LUMBAR 5-SACRAL ONE  . NECK SURGERY  2011  . NECK SURGERY  08/2016   Medications: Reviewed & Updated - see associated section                       Current Outpatient Medications:  .  clobetasol cream (TEMOVATE) 0.05 %, Apply 1 application  topically as needed., Disp: 30 g, Rfl: 1 .  norethindrone-ethinyl estradiol (FYAVOLV) 1-5 MG-MCG TABS tablet, Take 1 tablet by mouth daily., Disp: 84 tablet, Rfl: 3 .  valACYclovir (VALTREX) 1000 MG tablet, Take 1 tablet (1,000 mg total) by mouth daily., Disp: 90 tablet, Rfl: 2 .  gabapentin (NEURONTIN) 100 MG capsule, takes 300 mg at bedtime, Disp: , Rfl: 0 .  HYDROcodone-acetaminophen (NORCO/VICODIN) 5-325 MG tablet, Take 1 tablet by mouth every 6 (six) hours as needed for moderate pain. May take with ibuprofen (Patient not taking: Reported on 04/21/2018), Disp: 15 tablet, Rfl: 0   Social History: Reviewed -  reports that she has been smoking cigarettes.  She has a 9.00 pack-year smoking history. She has never used smokeless tobacco.  Objective Findings:  Vitals: Blood pressure 108/64, pulse 77, height  (1.676 m), weight 168 lb (76.2 kg).  PHYSICAL EXAMINATION General appearance - alert, well appearing, and in no distress, oriented to person, place, and time and normal appearing weight Mental status - alert, oriented to person, place, and time, normal mood, behavior, speech, dress, motor activity, and thought processes Chest -  Heart -  Abdomen - soft, nontender, nondistended, no masses or  organomegaly Breasts -  Skin - normal coloration and turgor, no rashes, no suspicious skin lesions noted Right thigh has 2 1-cm scars well healed, just distal to rt thigh crease.  PELVIC External genitalia - normal Vulva -  Vagina -  Cervix -   Uterus -   Adnexa -  [place]et [place] -  Rectal -     Assessment & Plan:   A:  1.  healed thigh abscess  P:  1.   Follow prn. 2. If recurs treat then, excise.  Discussed use of Hibiclens 3 times weekly alternating weeks to reduce chance of MRSA persistence

## 2018-04-25 DIAGNOSIS — M4722 Other spondylosis with radiculopathy, cervical region: Secondary | ICD-10-CM | POA: Diagnosis not present

## 2018-04-25 DIAGNOSIS — M545 Low back pain: Secondary | ICD-10-CM | POA: Diagnosis not present

## 2018-04-25 DIAGNOSIS — M542 Cervicalgia: Secondary | ICD-10-CM | POA: Diagnosis not present

## 2018-04-25 DIAGNOSIS — G894 Chronic pain syndrome: Secondary | ICD-10-CM | POA: Diagnosis not present

## 2018-05-26 DIAGNOSIS — M4722 Other spondylosis with radiculopathy, cervical region: Secondary | ICD-10-CM | POA: Diagnosis not present

## 2018-05-26 DIAGNOSIS — M545 Low back pain: Secondary | ICD-10-CM | POA: Diagnosis not present

## 2018-05-26 DIAGNOSIS — G894 Chronic pain syndrome: Secondary | ICD-10-CM | POA: Diagnosis not present

## 2018-05-26 DIAGNOSIS — M542 Cervicalgia: Secondary | ICD-10-CM | POA: Diagnosis not present

## 2018-07-11 DIAGNOSIS — M47812 Spondylosis without myelopathy or radiculopathy, cervical region: Secondary | ICD-10-CM | POA: Diagnosis not present

## 2018-07-21 DIAGNOSIS — M47812 Spondylosis without myelopathy or radiculopathy, cervical region: Secondary | ICD-10-CM | POA: Diagnosis not present

## 2018-08-11 DIAGNOSIS — M47812 Spondylosis without myelopathy or radiculopathy, cervical region: Secondary | ICD-10-CM | POA: Diagnosis not present

## 2018-09-01 DIAGNOSIS — M47812 Spondylosis without myelopathy or radiculopathy, cervical region: Secondary | ICD-10-CM | POA: Diagnosis not present

## 2018-09-15 DIAGNOSIS — M47812 Spondylosis without myelopathy or radiculopathy, cervical region: Secondary | ICD-10-CM | POA: Diagnosis not present

## 2018-10-10 DIAGNOSIS — M542 Cervicalgia: Secondary | ICD-10-CM | POA: Diagnosis not present

## 2018-10-10 DIAGNOSIS — M47812 Spondylosis without myelopathy or radiculopathy, cervical region: Secondary | ICD-10-CM | POA: Diagnosis not present

## 2019-02-21 ENCOUNTER — Other Ambulatory Visit: Payer: Self-pay | Admitting: Adult Health

## 2019-03-29 ENCOUNTER — Other Ambulatory Visit: Payer: Self-pay | Admitting: Adult Health

## 2019-04-10 ENCOUNTER — Other Ambulatory Visit: Payer: Self-pay | Admitting: Adult Health

## 2019-07-17 ENCOUNTER — Emergency Department (HOSPITAL_COMMUNITY): Payer: BLUE CROSS/BLUE SHIELD

## 2019-07-17 ENCOUNTER — Other Ambulatory Visit: Payer: Self-pay

## 2019-07-17 ENCOUNTER — Emergency Department (HOSPITAL_COMMUNITY)
Admission: EM | Admit: 2019-07-17 | Discharge: 2019-07-18 | Disposition: A | Discharge status: Home / Self Care | Payer: BLUE CROSS/BLUE SHIELD | Attending: Emergency Medicine | Admitting: Emergency Medicine

## 2019-07-17 ENCOUNTER — Encounter (HOSPITAL_COMMUNITY): Payer: Self-pay | Admitting: *Deleted

## 2019-07-17 DIAGNOSIS — R51 Headache: Secondary | ICD-10-CM | POA: Insufficient documentation

## 2019-07-17 DIAGNOSIS — R471 Dysarthria and anarthria: Secondary | ICD-10-CM | POA: Insufficient documentation

## 2019-07-17 DIAGNOSIS — M542 Cervicalgia: Secondary | ICD-10-CM | POA: Insufficient documentation

## 2019-07-17 DIAGNOSIS — G8929 Other chronic pain: Secondary | ICD-10-CM | POA: Insufficient documentation

## 2019-07-17 DIAGNOSIS — F17210 Nicotine dependence, cigarettes, uncomplicated: Secondary | ICD-10-CM | POA: Diagnosis not present

## 2019-07-17 DIAGNOSIS — R519 Headache, unspecified: Secondary | ICD-10-CM

## 2019-07-17 HISTORY — DX: Other chronic pain: G89.29

## 2019-07-17 LAB — CBC WITH DIFFERENTIAL/PLATELET
Abs Immature Granulocytes: 0.03 10*3/uL (ref 0.00–0.07)
Basophils Absolute: 0 10*3/uL (ref 0.0–0.1)
Basophils Relative: 0 %
Eosinophils Absolute: 0.2 10*3/uL (ref 0.0–0.5)
Eosinophils Relative: 2 %
HCT: 42.4 % (ref 36.0–46.0)
Hemoglobin: 13.7 g/dL (ref 12.0–15.0)
Immature Granulocytes: 0 %
Lymphocytes Relative: 35 %
Lymphs Abs: 3.6 10*3/uL (ref 0.7–4.0)
MCH: 32.2 pg (ref 26.0–34.0)
MCHC: 32.3 g/dL (ref 30.0–36.0)
MCV: 99.8 fL (ref 80.0–100.0)
Monocytes Absolute: 0.6 10*3/uL (ref 0.1–1.0)
Monocytes Relative: 6 %
Neutro Abs: 5.9 10*3/uL (ref 1.7–7.7)
Neutrophils Relative %: 57 %
Platelets: 189 10*3/uL (ref 150–400)
RBC: 4.25 MIL/uL (ref 3.87–5.11)
RDW: 13.3 % (ref 11.5–15.5)
WBC: 10.3 10*3/uL (ref 4.0–10.5)
nRBC: 0 % (ref 0.0–0.2)

## 2019-07-17 MED ORDER — METOCLOPRAMIDE HCL 5 MG/ML IJ SOLN
10.0000 mg | Freq: Once | INTRAMUSCULAR | Status: AC
  Administered 2019-07-17: 10 mg via INTRAVENOUS
  Filled 2019-07-17: qty 2

## 2019-07-17 MED ORDER — DIPHENHYDRAMINE HCL 50 MG/ML IJ SOLN
25.0000 mg | Freq: Once | INTRAMUSCULAR | Status: AC
  Administered 2019-07-17: 25 mg via INTRAVENOUS
  Filled 2019-07-17: qty 1

## 2019-07-17 MED ORDER — KETOROLAC TROMETHAMINE 30 MG/ML IJ SOLN
30.0000 mg | Freq: Once | INTRAMUSCULAR | Status: AC
  Administered 2019-07-17: 30 mg via INTRAVENOUS
  Filled 2019-07-17: qty 1

## 2019-07-17 NOTE — ED Triage Notes (Signed)
Patient with right side headache for 3 days.

## 2019-07-17 NOTE — ED Notes (Signed)
Pt c/o HA off and on and at places of head per pt for past few days.  Pt denies any numbness or fevers.  Pt did mention having some difficulty getting her words out 2 days ago. Pt's speech clear at present.

## 2019-07-17 NOTE — ED Provider Notes (Signed)
[facility] EMERGENCY DEPARTMENT Provider Note   CSN: [id number] Arrival date & time: 07/17/19  2013     History   Chief Complaint Chief Complaint  Patient presents with  . Headache    HPI Andrea Bailey is a 53 y.o. female.     Patient with history of chronic neck pain and previous back and neck surgery presenting with intermittent right-sided headache for the past 3 days.  She describes the pain as coming and going always on the right side of her head but in different places.  She states that it lasts for few seconds to minutes at a time.  There is a point where a resolved.  She did take ibuprofen at home with some relief.  There is been no focal weakness, numbness or tingling.  There is been no fever or vomiting.  She reports an episode 2 days ago of word finding difficulty "all day" where she could not say but she wanted to stay but this has resolved.  She denies any weakness in her arms or legs.  She reports no history of headaches.  She denies any thunderclap onset.  She denies any visual changes.  She denies any chest pain or shortness of breath.  She came in today with the insistence of her children. She describes the pain as a sharp pain on the right side of her head that comes and goes lasting for several seconds to minutes at a time but varies in location.  The history is provided by the patient.  Headache Associated symptoms: no abdominal pain, no congestion, no fever, no myalgias, no nausea and no vomiting     Past Medical History:  Diagnosis Date  . Back pain    buldging pain  . Chronic neck pain   . Contraceptive management 10/26/2013  . Hematoma 1994   after birth of second child  . Hemorrhoids   . Hemorrhoids 10/26/2013  . Herpes simplex without mention of complication   . History of herpes simplex type 2 infection 10/26/2013  . Hormone replacement therapy (HRT) 11/08/2015  . Itching in the vaginal area 10/26/2013  . Leg cramps 10/26/2013  . MRSA (methicillin  resistant Staphylococcus aureus)   . URI (upper respiratory infection) 11/02/2014    Patient Active Problem List   Diagnosis Date Noted  . Skin tags, multiple acquired 02/17/2018  . Hormone replacement therapy (HRT) 11/08/2015  . Itching in the vaginal area 10/26/2013  . Leg cramps 10/26/2013  . Hemorrhoids 10/26/2013  . Contraceptive management 10/26/2013  . History of herpes simplex type 2 infection 10/26/2013    Past Surgical History:  Procedure Laterality Date  . [place]   along with tubal ligation  . CHOLECYSTECTOMY  several yrs ago  . COLONOSCOPY  09/05/2011   Procedure: COLONOSCOPY;  Surgeon: Dorothyann Peng, MD;  [facility];  Service: Endoscopy;  Laterality: N/A;  11:30  . ENDOMETRIAL ABLATION  several yrs ago  . LUMBAR LAMINECTOMY/DECOMPRESSION MICRODISCECTOMY  12/28/2011   Procedure: LUMBAR LAMINECTOMY/DECOMPRESSION MICRODISCECTOMY;  Surgeon: Faythe Ghee, MD;  Location: [place] [facility];  Service: Neurosurgery;  Laterality: Right;  LUMBAR LAMINECTOMY DECOMPRESSION MICRODISCECTOMY LUMBAR 5-SACRAL ONE  . NECK SURGERY  2011  . NECK SURGERY  08/2016     OB History    Gravida  3   Para  3   Term      Preterm      AB      Living  3  SAB      TAB      Ectopic      Multiple      Live Births  3            Home Medications    Prior to Admission medications   Medication Sig Start Date End Date Taking? Authorizing Provider  clobetasol cream (TEMOVATE) 1.24 % APPLY 1 APPLICATION TOPICALLY AS NEEDED. 04/13/19   Estill Dooms, NP  FYAVOLV 1-5 MG-MCG TABS tablet TAKE 1 TABLET BY MOUTH EVERY DAY 03/30/19   Estill Dooms, NP  gabapentin (NEURONTIN) 100 MG capsule takes 300 mg at bedtime 03/17/18   [provider]  HYDROcodone-acetaminophen (NORCO/VICODIN) 5-325 MG tablet Take 1 tablet by mouth every 6 (six) hours as needed for moderate pain. May take with ibuprofen Patient not taking: Reported on 04/21/2018 04/03/18    Jonnie Kind, MD  valACYclovir (VALTREX) 1000 MG tablet TAKE 1 TABLET BY MOUTH EVERY DAY 02/23/19   Estill Dooms, NP    Family History Family History  Problem Relation Age of Onset  . Lung cancer Mother   . Lung cancer Father   . Diabetes Daughter   . Cancer Sister        skin  . COPD Sister   . Colon cancer Neg Hx   . Anesthesia problems Neg Hx   . Hypotension Neg Hx   . Malignant hyperthermia Neg Hx   . Pseudochol deficiency Neg Hx     Social History Social History   Tobacco Use  . Smoking status: Current Every Day Smoker    Packs/day: 1.00    Years: 9.00    Pack years: 9.00    Types: Cigarettes  . Smokeless tobacco: Never Used  Substance Use Topics  . Alcohol use: Yes    Comment: rarely  . Drug use: No     Allergies   Patient has no known allergies.   Review of Systems Review of Systems  Constitutional: Negative for activity change, appetite change and fever.  HENT: Negative for congestion and rhinorrhea.   Eyes: Negative for visual disturbance.  Respiratory: Negative for shortness of breath.   Cardiovascular: Negative for chest pain.  Gastrointestinal: Negative for abdominal pain, nausea and vomiting.  Genitourinary: Negative for dysuria, hematuria, vaginal bleeding and vaginal discharge.  Musculoskeletal: Negative for arthralgias and myalgias.  Skin: Negative for rash.  Neurological: Positive for speech difficulty and headaches.     all other systems are negative except as noted in the HPI and PMH.   Physical Exam Updated Vital Signs BP (!) 142/75 (BP Location: Right Arm)   Pulse 65   Temp 98.7 F (37.1 C) (Oral)   Resp 14   Ht '5\' 6"'$  (1.676 m)   Wt 76.2 kg   SpO2 99%   BMI 27.12 kg/m   Physical Exam Vitals signs and nursing note reviewed.  Constitutional:      General: She is not in acute distress.    Appearance: She is well-developed.  HENT:     Head: Normocephalic and atraumatic.     Comments: No temporal artery tenderness     Mouth/Throat:     Pharynx: No oropharyngeal exudate.  Eyes:     Conjunctiva/sclera: Conjunctivae normal.     Pupils: Pupils are equal, round, and reactive to light.  Neck:     Musculoskeletal: Normal range of motion and neck supple.     Comments: No meningismus. Cardiovascular:     Rate and Rhythm: Normal  rate and regular rhythm.     Heart sounds: Normal heart sounds. No murmur.  Pulmonary:     Effort: Pulmonary effort is normal. No respiratory distress.     Breath sounds: Normal breath sounds.  Abdominal:     Palpations: Abdomen is soft.     Tenderness: There is no abdominal tenderness. There is no guarding or rebound.  Musculoskeletal: Normal range of motion.        General: No tenderness.  Skin:    General: Skin is warm.     Capillary Refill: Capillary refill takes less than 2 seconds.  Neurological:     General: No focal deficit present.     Mental Status: She is alert and oriented to person, place, and time. Mental status is at baseline.     Cranial Nerves: No cranial nerve deficit.     Motor: No abnormal muscle tone.     Coordination: Coordination normal.     Comments: CN 2-12 intact, no ataxia on finger to nose, no nystagmus, 5/5 strength throughout, no pronator drift, Romberg negative, normal gait.   Psychiatric:        Behavior: Behavior normal.      ED Treatments / Results  Labs (all labs ordered are listed, but only abnormal results are displayed) Labs Reviewed  CBC WITH DIFFERENTIAL/PLATELET  BASIC METABOLIC PANEL  SEDIMENTATION RATE    EKG None  Radiology Ct Head Wo Contrast  Result Date: 07/17/2019 CLINICAL DATA:  53 year old female with right-sided headache x3 days. EXAM: CT HEAD WITHOUT CONTRAST TECHNIQUE: Contiguous axial images were obtained from the base of the skull through the vertex without intravenous contrast. COMPARISON:  None. FINDINGS: Brain: There is mild age-related atrophy and chronic microvascular ischemic changes. There is no  acute intracranial hemorrhage. No mass effect or midline shift. No extra-axial fluid collection. Vascular: No hyperdense vessel or unexpected calcification. Skull: Normal. Negative for fracture or focal lesion. Sinuses/Orbits: No acute finding. Other: None IMPRESSION: 1. No acute intracranial hemorrhage. 2. Mild age-related atrophy and chronic microvascular ischemic changes. Electronically Signed   By: Anner Crete M.D.   On: 07/17/2019 23:13    Procedures Procedures (including critical care time)  Medications Ordered in ED Medications  ketorolac (TORADOL) 30 MG/ML injection 30 mg (has no administration in time range)  metoCLOPramide (REGLAN) injection 10 mg (has no administration in time range)  diphenhydrAMINE (BENADRYL) injection 25 mg (has no administration in time range)     Initial Impression / Assessment and Plan / ED Course  I have reviewed the triage vital signs and the nursing notes.  Pertinent labs & imaging results that were available during my care of the patient were reviewed by me and considered in my medical decision making (see chart for details).       Intermittent right-sided headaches for the past 3 days.  Episode of word finding difficulty 2 days ago.  Nonfocal neurological exam currently.  CT head is negative.  No temporal artery tenderness.  Low suspicion for subarachnoid hemorrhage, meningitis, temporal arteritis ESR normal.   Concern for possible TIA.  Patient had word finding difficulty 2 days ago but none now.  Discussed with neurology Dr. Enid Derry who saw patient and agrees with CTA and admission for stroke work-up with MRI.  MRI not available at this facility. Patient states she is going home and does not want to stay for CTA scan or MRI.  She states she needs to go to work tomorrow and will come back if she gets  worse.  She understands that she will be leaving [place] as a stroke is not been ruled out.  She appears to have capacity  to make medical decisions.  Final Clinical Impressions(s) / ED Diagnoses   Final diagnoses:  Bad headache  Dysarthria    ED Discharge Orders    None       Corene Resnick, Annie Main, MD 07/18/19 [phone]

## 2019-07-18 ENCOUNTER — Emergency Department (HOSPITAL_COMMUNITY): Payer: BLUE CROSS/BLUE SHIELD

## 2019-07-18 LAB — BASIC METABOLIC PANEL
Anion gap: 9 (ref 5–15)
BUN: 11 mg/dL (ref 6–20)
CO2: 24 mmol/L (ref 22–32)
Calcium: 9.2 mg/dL (ref 8.9–10.3)
Chloride: 108 mmol/L (ref 98–111)
Creatinine, Ser: 0.64 mg/dL (ref 0.44–1.00)
GFR calc Af Amer: >60 mL/min (ref >60)
GFR calc non Af Amer: >60 mL/min (ref >60)
Glucose, Bld: 90 mg/dL (ref 70–99)
Potassium: 3.9 mmol/L (ref 3.5–5.1)
Sodium: 141 mmol/L (ref 135–145)

## 2019-07-18 LAB — SEDIMENTATION RATE: Sed Rate: 7 mm/hr (ref 0–22)

## 2019-07-18 MED ORDER — IOHEXOL 350 MG/ML SOLN: 100.0000 mL | Freq: Once | INTRAVENOUS | Status: DC | PRN

## 2019-07-18 NOTE — Consult Note (Signed)
[facility] Tele[facility] Consult Services  Stat Consult  Date of Service:   07/17/2019 23:44:27  Impression:     .  Transient Ischemic Attack  Comments/Sign-Out: Patient with headache fro 3 days. No history of migraines. Episode of words finding difficulties. Concern for TIA. Agreed with CTA head and neck. Will recommned TIA work up. Start ASA 81 mg/d  CT HEAD: Showed No Acute Hemorrhage or Acute Core Infarct  Metrics: [facility] Notification Time: 07/17/2019 23:44:27 Stamp Time: 07/17/2019 23:44:27 Video Start Time: 07/18/2019 00:02:38 Video End Time: 07/18/2019 00:08:09  Our recommendations are outlined below.  Recommendations:     .  Antiplatelet Therapy   Imaging Studies:     .  MRI Head  Disposition: Neurology Follow Up Recommended  Sign Out:     .  Discussed with Emergency Department Provider  ----------------------------------------------------------------------------------------------------  Chief Complaint: headaches for 3 days  History of Present Illness: Patient is a 53 year old Female.  Patient reported having headaches for 3 days. She never suffered from headaches like this so she got worried and decided to come for evaluation. She said the first day was the worse pain that is localized to the right side. On the second day she had an episode of words finding difficulties. Today the headache still present but not as bad. She denied history of headaches and denied strokes. She is a smoker of 1ppd.   Examination: 1A: Level of Consciousness - Alert; keenly responsive + 0 1B: Ask Month and Age - Both Questions Right + 0 1C: Blink Eyes & Squeeze Hands - Performs Both Tasks + 0 2: Test Horizontal Extraocular Movements - Normal + 0 3: Test Visual Fields - No Visual Loss + 0 4: Test Facial Palsy (Use Grimace if Obtunded) - Normal symmetry + 0 5A: Test Left Arm Motor Drift - No Drift for 10 Seconds + 0 5B: Test Right Arm Motor Drift - No Drift for 10  Seconds + 0 6A: Test Left Leg Motor Drift - No Drift for 5 Seconds + 0 6B: Test Right Leg Motor Drift - No Drift for 5 Seconds + 0 7: Test Limb Ataxia (FNF/Heel-Shin) - No Ataxia + 0 8: Test Sensation - Normal; No sensory loss + 0 9: Test Language/Aphasia - Normal; No aphasia + 0 10: Test Dysarthria - Normal + 0 11: Test Extinction/Inattention - No abnormality + 0  NIHSS Score: 0   Due to the immediate potential for life-threatening deterioration due to underlying acute neurologic illness, I spent 35 minutes providing critical care. This time includes time for face to face visit via telemedicine, review of medical records, imaging studies and discussion of findings with providers, the patient and/or family.   Dr Anabel Halon   [facility] [phone]  Case [id number]

## 2019-07-18 NOTE — ED Notes (Signed)
Pt wanting to leave. Pt aware of need for CT scan. Pt states her headache is better and she has to work. Will come back tomorrow after work for further workup if needed. Dr. Wyvonnia Dusky aware.

## 2019-11-17 ENCOUNTER — Other Ambulatory Visit: Payer: Self-pay | Admitting: Adult Health

## 2020-01-06 ENCOUNTER — Encounter: Payer: Self-pay | Admitting: Family Medicine

## 2020-03-07 ENCOUNTER — Other Ambulatory Visit: Payer: Self-pay | Admitting: Adult Health

## 2020-08-14 ENCOUNTER — Other Ambulatory Visit: Payer: Self-pay | Admitting: Adult Health

## 2021-02-13 ENCOUNTER — Other Ambulatory Visit: Payer: Self-pay | Admitting: Adult Health

## 2021-08-04 ENCOUNTER — Encounter: Payer: Self-pay | Admitting: *Deleted

## 2021-09-21 ENCOUNTER — Other Ambulatory Visit: Payer: Self-pay | Admitting: Adult Health

## 2021-12-18 ENCOUNTER — Ambulatory Visit (INDEPENDENT_AMBULATORY_CARE_PROVIDER_SITE_OTHER): Payer: BC Managed Care – PPO

## 2021-12-18 ENCOUNTER — Other Ambulatory Visit: Payer: Self-pay

## 2021-12-18 ENCOUNTER — Ambulatory Visit
Admission: RE | Admit: 2021-12-18 | Discharge: 2021-12-18 | Disposition: A | Discharge status: Home / Self Care | Payer: BC Managed Care – PPO | Source: Ambulatory Visit | Attending: Urgent Care | Admitting: Urgent Care

## 2021-12-18 VITALS — BP 159/76 | HR 97 | Temp 98.5°F | Resp 18 | Ht 66.0 in | Wt 170.0 lb

## 2021-12-18 DIAGNOSIS — M546 Pain in thoracic spine: Secondary | ICD-10-CM

## 2021-12-18 DIAGNOSIS — M5134 Other intervertebral disc degeneration, thoracic region: Secondary | ICD-10-CM

## 2021-12-18 DIAGNOSIS — Z9889 Other specified postprocedural states: Secondary | ICD-10-CM

## 2021-12-18 MED ORDER — PREDNISONE 20 MG PO TABS: ORAL_TABLET | ORAL | 0 refills | Status: DC

## 2021-12-18 MED ORDER — TIZANIDINE HCL 4 MG PO TABS: 4.0000 mg | ORAL_TABLET | Freq: Every day | ORAL | 0 refills | Status: DC

## 2021-12-18 NOTE — ED Provider Notes (Signed)
[facility]   MRN: [id number] DOB: 09-Jun-1966  Subjective:   Andrea Bailey is a 56 y.o. female presenting for 1 month history of progressively worsening mid thoracic back pain that radiates laterally towards her flank side.  Denies any fall, trauma, numbness, tingling, rashes.  Patient has an extensive back history with surgeries of the neck and lumbar spine.  She does not follow-up with a spine specialist.  Denies any weakness, history of kidney stone, hematuria.  She generally does not like to take medications for her symptoms but as this is worsening would like to consider some.  She would also like recommendations about where to go for primary care.  No current facility-administered medications for this encounter.  Current Outpatient Medications:    clobetasol cream (TEMOVATE) 0.05 %, APPLY 1 APPLICATION TOPICALLY AS NEEDED., Disp: 30 g, Rfl: 1   FYAVOLV 1-5 MG-MCG TABS tablet, TAKE 1 TABLET BY MOUTH EVERY DAY, Disp: 28 tablet, Rfl: 11   gabapentin (NEURONTIN) 100 MG capsule, takes 300 mg at bedtime, Disp: , Rfl: 0   HYDROcodone-acetaminophen (NORCO/VICODIN) 5-325 MG tablet, Take 1 tablet by mouth every 6 (six) hours as needed for moderate pain. May take with ibuprofen (Patient not taking: Reported on 04/21/2018), Disp: 15 tablet, Rfl: 0   valACYclovir (VALTREX) 1000 MG tablet, TAKE 1 TABLET BY MOUTH EVERY DAY, Disp: 90 tablet, Rfl: 2   No Known Allergies  Past Medical History:  Diagnosis Date   Back pain    buldging pain   Chronic neck pain    Contraceptive management 10/26/2013   Hematoma 1994   after birth of second child   Hemorrhoids    Hemorrhoids 10/26/2013   Herpes simplex without mention of complication    History of herpes simplex type 2 infection 10/26/2013   Hormone replacement therapy (HRT) 11/08/2015   Itching in the vaginal area 10/26/2013   Leg cramps 10/26/2013   MRSA (methicillin resistant Staphylococcus aureus)    URI (upper respiratory  infection) 11/02/2014     Past Surgical History:  Procedure Laterality Date   CESAREAN SECTION  1998   along with tubal ligation   CHOLECYSTECTOMY  several yrs ago   COLONOSCOPY  09/05/2011   Procedure: COLONOSCOPY;  Surgeon: Arlyce Harman, MD;  Location: [facility];  Service: Endoscopy;  Laterality: N/A;  11:30   ENDOMETRIAL ABLATION  several yrs ago   LUMBAR LAMINECTOMY/DECOMPRESSION MICRODISCECTOMY  12/28/2011   Procedure: LUMBAR LAMINECTOMY/DECOMPRESSION MICRODISCECTOMY;  Surgeon: Reinaldo Meeker, MD;  Location: [facility];  Service: Neurosurgery;  Laterality: Right;  LUMBAR LAMINECTOMY DECOMPRESSION MICRODISCECTOMY LUMBAR 5-SACRAL ONE   NECK SURGERY  2011   NECK SURGERY  08/2016    Family History  Problem Relation Age of Onset   Lung cancer Mother    Lung cancer Father    Diabetes Daughter    Cancer Sister        skin   COPD Sister    Colon cancer Neg Hx    Anesthesia problems Neg Hx    Hypotension Neg Hx    Malignant hyperthermia Neg Hx    Pseudochol deficiency Neg Hx     Social History   Tobacco Use   Smoking status: Every Day    Packs/day: 1.00    Years: 9.00    Pack years: 9.00    Types: Cigarettes   Smokeless tobacco: Never  Substance Use Topics   Alcohol use: Yes    Comment: rarely   Drug use: No  ROS   Objective:   Vitals: BP (!) 159/76 (BP Location: Right Arm)    Pulse 97    Temp 98.5 F (36.9 C) (Oral)    Resp 18    Ht 5\' 6"  (1.676 m)    Wt 170 lb (77.1 kg)    SpO2 98%    BMI 27.44 kg/m   Physical Exam Constitutional:      General: She is not in acute distress.    Appearance: Normal appearance. She is well-developed. She is not ill-appearing, toxic-appearing or diaphoretic.  HENT:     Head: Normocephalic and atraumatic.     Nose: Nose normal.     Mouth/Throat:     Mouth: Mucous membranes are moist.  Eyes:     General: No scleral icterus.       Right eye: No discharge.        Left eye: No discharge.     Extraocular Movements:  Extraocular movements intact.     Conjunctiva/sclera: Conjunctivae normal.  Cardiovascular:     Rate and Rhythm: Normal rate.  Pulmonary:     Effort: Pulmonary effort is normal.  Musculoskeletal:     Thoracic back: Tenderness (over area outlined, not elicited on exam) present. No swelling, edema, deformity, signs of trauma, lacerations, spasms or bony tenderness. Normal range of motion. No scoliosis.       Back:  Skin:    General: Skin is warm and dry.  Neurological:     General: No focal deficit present.     Mental Status: She is alert and oriented to person, place, and time.     Motor: No weakness.     Coordination: Coordination normal.     Gait: Gait normal.  Psychiatric:        Mood and Affect: Mood normal.        Behavior: Behavior normal.        Thought Content: Thought content normal.        Judgment: Judgment normal.    DG Thoracic Spine 2 View  Result Date: 12/18/2021 CLINICAL DATA:  thoracic back pain EXAM: THORACIC SPINE 2 VIEWS COMPARISON:  thoracic back pain FINDINGS: Multilevel mild degenerative changes of the spine. There is no evidence of thoracic spine fracture. Alignment is normal. No other significant bone abnormalities are identified. IMPRESSION: No acute displaced fracture or traumatic listhesis of the thoracic spine. Electronically Signed   By: Iven Finn M.D.   On: 12/18/2021 17:24     Assessment and Plan :   I have reviewed the PDMP during this encounter.  1. Acute right-sided thoracic back pain   2. Degenerative disc disease, thoracic   3. History of neck surgery   4. History of back surgery    Given radiology over read of multilevel degenerative changes of the spine, recommended an oral prednisone course.  Use muscle relaxant as needed.  Follow-up with a spine specialist.  I helped set patient up with a new primary care provider including securing an appointment with her.  She plans on establishing care soon. Counseled patient on potential for  adverse effects with medications prescribed/recommended today, ER and return-to-clinic precautions discussed, patient verbalized understanding.    Jaynee Eagles, [place] 12/18/21 1746
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
## 2021-12-19 ENCOUNTER — Other Ambulatory Visit: Payer: Self-pay | Admitting: Adult Health

## 2022-01-23 ENCOUNTER — Encounter (HOSPITAL_BASED_OUTPATIENT_CLINIC_OR_DEPARTMENT_OTHER): Payer: Self-pay | Admitting: Family Medicine

## 2022-01-23 ENCOUNTER — Other Ambulatory Visit: Payer: Self-pay

## 2022-01-23 ENCOUNTER — Ambulatory Visit (HOSPITAL_BASED_OUTPATIENT_CLINIC_OR_DEPARTMENT_OTHER): Payer: BC Managed Care – PPO | Admitting: Family Medicine

## 2022-01-23 VITALS — BP 117/72 | HR 94 | Ht 66.0 in | Wt 171.4 lb

## 2022-01-23 DIAGNOSIS — G8929 Other chronic pain: Secondary | ICD-10-CM

## 2022-01-23 DIAGNOSIS — M67431 Ganglion, right wrist: Secondary | ICD-10-CM | POA: Diagnosis not present

## 2022-01-23 DIAGNOSIS — M549 Dorsalgia, unspecified: Secondary | ICD-10-CM

## 2022-01-23 DIAGNOSIS — Z0000 Encounter for general adult medical examination without abnormal findings: Secondary | ICD-10-CM

## 2022-01-23 DIAGNOSIS — M542 Cervicalgia: Secondary | ICD-10-CM | POA: Diagnosis not present

## 2022-01-23 DIAGNOSIS — R21 Rash and other nonspecific skin eruption: Secondary | ICD-10-CM | POA: Insufficient documentation

## 2022-01-23 NOTE — Progress Notes (Signed)
New Patient Office Visit  Subjective:  Patient ID: Andrea Bailey, female    DOB: September 08, 1966  Age: 56 y.o. MRN: [id number]  CC:  Chief Complaint  Patient presents with   New Patient (Initial Visit)    Patient presents today to establish care. She has not seen a PCP in years. She is concerned with back pain. Right wrist ? Cyst.     HPI Andrea Bailey is a 56 year old female presenting to establish in clinic.  She has current concerns as outlined above.  Past medical history is significant for notable neck and back pain, HSV.  Neck and back pain: Patient reports chronic issues with neck and back.  Indicates that she has had 2 cervical spine surgeries in the past.  She also has history of lumbar laminectomy/decompression, the surgery was about 10 years ago.  Most recently, patient did have evaluation with pain management provider in 2019.  At that time, she had radiofrequency ablation at cervical spine.  She does continue to have intermittent pain.  She would be interested in further evaluation with specialist as she reports that prior RFA did not provide significant relief.  Right wrist cyst: Began and some swelling over her right wrist about 1 year ago.  Has been unchanged over the years.  Does not have any associated pain.  Patient is right-handed.  Current swelling does not impair any activities.  She has not had any drainage from the area, no overlying redness.  Patient reports that she did smoke cigarettes from ages [age] and then quit for about a 12-year period and resumed smoking at the age of 63.  She currently smokes 1 pack/day.  Would have some interest in quitting.  Patient would like referral to dermatology for skin check and due to intermittent rash.  Past Medical History:  Diagnosis Date   Back pain    buldging pain   Chronic neck pain    Contraceptive management 10/26/2013   Hematoma 1994   after birth of second child   Hemorrhoids    Hemorrhoids 10/26/2013   Herpes  simplex without mention of complication    History of herpes simplex type 2 infection 10/26/2013   Hormone replacement therapy (HRT) 11/08/2015   Itching in the vaginal area 10/26/2013   Leg cramps 10/26/2013   MRSA (methicillin resistant Staphylococcus aureus)    URI (upper respiratory infection) 11/02/2014    Past Surgical History:  Procedure Laterality Date   CESAREAN SECTION  1998   along with tubal ligation   CHOLECYSTECTOMY  several yrs ago   COLONOSCOPY  09/05/2011   Procedure: COLONOSCOPY;  Surgeon: Arlyce Harman, MD;  Location: [facility];  Service: Endoscopy;  Laterality: N/A;  11:30   ENDOMETRIAL ABLATION  several yrs ago   LUMBAR LAMINECTOMY/DECOMPRESSION MICRODISCECTOMY  12/28/2011   Procedure: LUMBAR LAMINECTOMY/DECOMPRESSION MICRODISCECTOMY;  Surgeon: Reinaldo Meeker, MD;  Location: [facility];  Service: Neurosurgery;  Laterality: Right;  LUMBAR LAMINECTOMY DECOMPRESSION MICRODISCECTOMY LUMBAR 5-SACRAL ONE   NECK SURGERY  2011   NECK SURGERY  08/2016    Family History  Problem Relation Age of Onset   Lung cancer Mother    Lung cancer Father    Diabetes Daughter    Cancer Sister        skin   COPD Sister    Colon cancer Neg Hx    Anesthesia problems Neg Hx    Hypotension Neg Hx    Malignant hyperthermia Neg Hx    Pseudochol  deficiency Neg Hx     Social History   Socioeconomic History   Marital status: Divorced    Spouse name: Not on file   Number of children: Not on file   Years of education: Not on file   Highest education level: Not on file  Occupational History   Occupation: Emergency planning/management officer  Tobacco Use   Smoking status: Every Day    Packs/day: 1.00    Years: 9.00    Pack years: 9.00    Types: Cigarettes   Smokeless tobacco: Never  Substance and Sexual Activity   Alcohol use: Yes    Comment: rarely   Drug use: No   Sexual activity: Yes    Birth control/protection: Surgical    Comment: ablation  Other Topics Concern   Not on file  Social History  Narrative   Not on file   Social Determinants of Health   Financial Resource Strain: Not on file  Food Insecurity: Not on file  Transportation Needs: Not on file  Physical Activity: Not on file  Stress: Not on file  Social Connections: Not on file  Intimate Partner Violence: Not on file    Objective:   Today's Vitals: BP 117/72    Pulse 94    Ht 5\' 6"  (1.676 m)    Wt 171 lb 6.4 oz (77.7 kg)    SpO2 98%    BMI 27.66 kg/m   Physical Exam  55 year old female in no acute distress Cardiovascular exam regular rate and rhythm, no murmur appreciated Lungs clear to auscultation bilaterally Right ventral wrist with swelling over radial aspect, slightly proximal to wrist.  Area is mobile, nontender, nonpulsatile, relatively firm.  No overlying redness or erythema.  Assessment & Plan:   Problem List Items Addressed This Visit       Musculoskeletal and Integument   Rash    Requesting dermatology for further evaluation, referral placed today      Relevant Orders   Ambulatory referral to Dermatology     Other   Cervicalgia    Patient with chronic neck and back pain with prior cervical spine and lumbar spine surgeries Most recently she did have RFA completed with pain management provider which did not provide adequate relief Will refer patient for further evaluation and treatment considerations      Relevant Orders   Ambulatory referral to Neurology   Back pain    Management as per above, will refer to spine specialist.      Ganglion cyst of wrist, right    Area over wrist appears most consistent with ganglion cyst.  Discussed etiology with patient as well as treatment considerations Given that it is not causing her significant symptoms at present, she wishes to monitor for now Advised that if she notices any progression of areas such as increased swelling, pain or other symptoms, to return to clinic for evaluation Discussed options of aspiration of cyst for referral to  surgical specialist for excision consideration      Other Visit Diagnoses     Wellness examination    -  Primary       Outpatient Encounter Medications as of 01/23/2022  Medication Sig   clobetasol cream (TEMOVATE) 0.05 % APPLY 1 APPLICATION TOPICALLY AS NEEDED.   FYAVOLV 1-5 MG-MCG TABS tablet TAKE 1 TABLET BY MOUTH EVERY DAY   gabapentin (NEURONTIN) 100 MG capsule takes 300 mg at bedtime   HYDROcodone-acetaminophen (NORCO/VICODIN) 5-325 MG tablet Take 1 tablet by mouth every 6 (six) hours as needed  for moderate pain. May take with ibuprofen (Patient not taking: Reported on 04/21/2018)   predniSONE (DELTASONE) 20 MG tablet Take 2 tablets daily with breakfast.   tiZANidine (ZANAFLEX) 4 MG tablet Take 1 tablet (4 mg total) by mouth at bedtime.   valACYclovir (VALTREX) 1000 MG tablet TAKE 1 TABLET BY MOUTH EVERY DAY   No facility-administered encounter medications on file as of 01/23/2022.    Follow-up: No follow-ups on file.   Jessikah Dicker J De [place], MD

## 2022-01-23 NOTE — Patient Instructions (Signed)
°  Medication Instructions:  Your physician recommends that you continue on your current medications as directed. Please refer to the Current Medication list given to you today. --If you need a refill on any your medications before your next appointment, please call your pharmacy first. If no refills are authorized on file call the office.-- Lab Work: Your physician has recommended that you have lab work today: 1 week prior to CPE visit If you have labs (blood work) drawn today and your tests are completely normal, you will receive your results via MyChart message OR a phone call from our staff.  Please ensure you check your voicemail in the event that you authorized detailed messages to be left on a delegated number. If you have any lab test that is abnormal or we need to change your treatment, we will call you to review the results.  Referrals/Procedures/Imaging: [facility]  Follow-Up: Your next appointment:   Your physician recommends that you schedule a follow-up appointment in: 6wk CPE with Dr. de [place]  You will receive a text message or e-mail with a link to a survey about your care and experience with [place] today! We would greatly appreciate your feedback!   Thanks for letting us be apart of your health journey!!  Primary Care and Sports Medicine   Dr. Ceasar Mons [place]   We encourage you to activate your patient portal called "MyChart".  Sign up information is provided on this After Visit Summary.  MyChart is used to connect with patients for Virtual Visits (Telemedicine).  Patients are able to view lab/test results, encounter notes, upcoming appointments, etc.  Non-urgent messages can be sent to your provider as well. To learn more about what you can do with MyChart, please visit --  [email or web address].

## 2022-01-31 ENCOUNTER — Telehealth: Payer: Self-pay | Admitting: Dermatology

## 2022-01-31 DIAGNOSIS — M67431 Ganglion, right wrist: Secondary | ICD-10-CM | POA: Insufficient documentation

## 2022-01-31 NOTE — Assessment & Plan Note (Signed)
Area over wrist appears most consistent with ganglion cyst.  Discussed etiology with patient as well as treatment considerations ?Given that it is not causing her significant symptoms at present, she wishes to monitor for now ?Advised that if she notices any progression of areas such as increased swelling, pain or other symptoms, to return to clinic for evaluation ?Discussed options of aspiration of cyst for referral to surgical specialist for excision consideration ?

## 2022-01-31 NOTE — Telephone Encounter (Signed)
Notes documented and referral routed back to referring office. 

## 2022-01-31 NOTE — Assessment & Plan Note (Signed)
Requesting dermatology for further evaluation, referral placed today ?

## 2022-01-31 NOTE — Assessment & Plan Note (Signed)
Patient with chronic neck and back pain with prior cervical spine and lumbar spine surgeries ?Most recently she did have RFA completed with pain management provider which did not provide adequate relief ?Will refer patient for further evaluation and treatment considerations ?

## 2022-01-31 NOTE — Telephone Encounter (Signed)
Please ask referrer, Dr Tommi Rumps [place]'s office, to see if they can get her in elsewhere before Sept/Oct ?

## 2022-01-31 NOTE — Assessment & Plan Note (Signed)
Management as per above, will refer to spine specialist. ?

## 2022-02-14 DIAGNOSIS — M5414 Radiculopathy, thoracic region: Secondary | ICD-10-CM | POA: Diagnosis not present

## 2022-02-15 ENCOUNTER — Other Ambulatory Visit: Payer: Self-pay | Admitting: Neurosurgery

## 2022-02-15 ENCOUNTER — Other Ambulatory Visit (HOSPITAL_COMMUNITY): Payer: Self-pay | Admitting: Neurosurgery

## 2022-02-15 DIAGNOSIS — M5414 Radiculopathy, thoracic region: Secondary | ICD-10-CM

## 2022-02-27 ENCOUNTER — Other Ambulatory Visit: Payer: Self-pay

## 2022-02-27 ENCOUNTER — Other Ambulatory Visit (HOSPITAL_BASED_OUTPATIENT_CLINIC_OR_DEPARTMENT_OTHER): Payer: Self-pay | Admitting: Family Medicine

## 2022-02-27 ENCOUNTER — Ambulatory Visit (HOSPITAL_BASED_OUTPATIENT_CLINIC_OR_DEPARTMENT_OTHER): Payer: BC Managed Care – PPO

## 2022-02-27 DIAGNOSIS — Z0000 Encounter for general adult medical examination without abnormal findings: Secondary | ICD-10-CM | POA: Diagnosis not present

## 2022-02-28 LAB — CBC WITH DIFFERENTIAL/PLATELET
Basophils Absolute: 0.1 10*3/uL (ref 0.0–0.2)
Basos: 1 %
EOS (ABSOLUTE): 0.1 10*3/uL (ref 0.0–0.4)
Eos: 1 %
Hematocrit: 40.4 % (ref 34.0–46.6)
Hemoglobin: 14.1 g/dL (ref 11.1–15.9)
Immature Grans (Abs): 0 10*3/uL (ref 0.0–0.1)
Immature Granulocytes: 0 %
Lymphocytes Absolute: 3.1 10*3/uL (ref 0.7–3.1)
Lymphs: 28 %
MCH: 33.5 pg — ABNORMAL HIGH (ref 26.6–33.0)
MCHC: 34.9 g/dL (ref 31.5–35.7)
MCV: 96 fL (ref 79–97)
Monocytes Absolute: 0.7 10*3/uL (ref 0.1–0.9)
Monocytes: 7 %
Neutrophils Absolute: 6.9 10*3/uL (ref 1.4–7.0)
Neutrophils: 63 %
Platelets: 212 10*3/uL (ref 150–450)
RBC: 4.21 x10E6/uL (ref 3.77–5.28)
RDW: 12.4 % (ref 11.7–15.4)
WBC: 11 10*3/uL — ABNORMAL HIGH (ref 3.4–10.8)

## 2022-02-28 LAB — COMPREHENSIVE METABOLIC PANEL
ALT: 72 IU/L — ABNORMAL HIGH (ref 0–32)
AST: 56 IU/L — ABNORMAL HIGH (ref 0–40)
Albumin/Globulin Ratio: 1.7 (ref 1.2–2.2)
Albumin: 4.7 g/dL (ref 3.8–4.9)
Alkaline Phosphatase: 70 IU/L (ref 44–121)
BUN/Creatinine Ratio: 12 (ref 9–23)
BUN: 7 mg/dL (ref 6–24)
Bilirubin Total: 0.4 mg/dL (ref 0.0–1.2)
CO2: 19 mmol/L — ABNORMAL LOW (ref 20–29)
Calcium: 9.8 mg/dL (ref 8.7–10.2)
Chloride: 103 mmol/L (ref 96–106)
Creatinine, Ser: 0.58 mg/dL (ref 0.57–1.00)
Globulin, Total: 2.8 g/dL (ref 1.5–4.5)
Glucose: 77 mg/dL (ref 70–99)
Potassium: 5.2 mmol/L (ref 3.5–5.2)
Sodium: 139 mmol/L (ref 134–144)
Total Protein: 7.5 g/dL (ref 6.0–8.5)
eGFR: 105 mL/min/{1.73_m2} (ref >59)

## 2022-02-28 LAB — LIPID PANEL
Chol/HDL Ratio: 4.2 ratio (ref 0.0–4.4)
Cholesterol, Total: 186 mg/dL (ref 100–199)
HDL: 44 mg/dL (ref >39)
LDL Chol Calc (NIH): 117 mg/dL — ABNORMAL HIGH (ref 0–99)
Triglycerides: 140 mg/dL (ref 0–149)
VLDL Cholesterol Cal: 25 mg/dL (ref 5–40)

## 2022-02-28 LAB — TSH: TSH: 2.3 u[IU]/mL (ref 0.450–4.500)

## 2022-02-28 LAB — HEMOGLOBIN A1C
Est. average glucose Bld gHb Est-mCnc: 111 mg/dL
Hgb A1c MFr Bld: 5.5 % (ref 4.8–5.6)

## 2022-03-06 ENCOUNTER — Other Ambulatory Visit (INDEPENDENT_AMBULATORY_CARE_PROVIDER_SITE_OTHER): Payer: BC Managed Care – PPO

## 2022-03-06 ENCOUNTER — Other Ambulatory Visit (HOSPITAL_BASED_OUTPATIENT_CLINIC_OR_DEPARTMENT_OTHER): Payer: Self-pay | Admitting: Family Medicine

## 2022-03-06 ENCOUNTER — Ambulatory Visit (INDEPENDENT_AMBULATORY_CARE_PROVIDER_SITE_OTHER): Payer: BC Managed Care – PPO | Admitting: Family Medicine

## 2022-03-06 VITALS — BP 117/72 | HR 85 | Temp 98.7°F | Ht 67.0 in | Wt 163.6 lb

## 2022-03-06 DIAGNOSIS — Z23 Encounter for immunization: Secondary | ICD-10-CM | POA: Diagnosis not present

## 2022-03-06 DIAGNOSIS — Z1211 Encounter for screening for malignant neoplasm of colon: Secondary | ICD-10-CM

## 2022-03-06 DIAGNOSIS — R7401 Elevation of levels of liver transaminase levels: Secondary | ICD-10-CM | POA: Diagnosis not present

## 2022-03-06 DIAGNOSIS — Z0000 Encounter for general adult medical examination without abnormal findings: Secondary | ICD-10-CM | POA: Insufficient documentation

## 2022-03-06 NOTE — Patient Instructions (Signed)
?  Medication Instructions:  ?Your physician recommends that you continue on your current medications as directed. Please refer to the Current Medication list given to you today. ?--If you need a refill on any your medications before your next appointment, please call your pharmacy first. If no refills are authorized on file call the office.-- ?Lab Work: ?Your physician has recommended that you have lab work today: will call with results ?If you have labs (blood work) drawn today and your tests are completely normal, you will receive your results via MyChart message OR a phone call from our staff.  ?Please ensure you check your voicemail in the event that you authorized detailed messages to be left on a delegated number. If you have any lab test that is abnormal or we need to change your treatment, we will call you to review the results. ? ?Referrals/Procedures/Imaging: ?GI ?Ultrasound ? ?Follow-Up: ?Your next appointment:   ?Your physician recommends that you schedule a follow-up appointment in: 6 month follow up with Dr. Tommi Rumps [place] ? ?You will receive a text message or e-mail with a link to a survey about your care and experience with [place] today! We would greatly appreciate your feedback!  ? ?Thanks for letting us be apart of your health journey!!  ?Primary Care and Sports Medicine  ? ?Dr. Marcy Salvo de [place]  ? ?We encourage you to activate your patient portal called "MyChart".  Sign up information is provided on this After Visit Summary.  MyChart is used to connect with patients for Virtual Visits (Telemedicine).  Patients are able to view lab/test results, encounter notes, upcoming appointments, etc.  Non-urgent messages can be sent to your provider as well. To learn more about what you can do with MyChart, please visit --  [email or web address].   ? ?

## 2022-03-06 NOTE — Assessment & Plan Note (Addendum)
Routine HCM labs reviewed and discussed. HCM reviewed/discussed. Anticipatory guidance regarding healthy weight, lifestyle and choices given. ?Recommend healthy diet.  Recommend approximately 150 minutes/week of moderate intensity exercise ?Recommend regular dental and vision exams ?Always use seatbelt/lap and shoulder restraints ?Recommend using smoke alarms and checking batteries at least twice a year ?Recommend using sunscreen when outside ?Recommend shingles vaccine, patient declines at this time, citing that her sister had adverse effects from the vaccine ?Recommend colon cancer screening, she reports that this was done in the past, not sure how long ago but feels that she is due.  Referral placed for patient to have colonoscopy completed in [place] ?Patient is in need of tetanus booster, amenable to this today, administered ?Also does appear that she is due for cervical cancer screening as well as breast cancer screening, she indicates that she usually does this through her OB/GYN, plans to arrange for follow-up with him ?Given patient age and smoking history, it is recommended to have lung cancer screening completed, she declines at this time due to other imaging and testing being done and would prefer to wait until another time to do this ?

## 2022-03-06 NOTE — Progress Notes (Signed)
?Subjective:   ? ?CC: Annual Physical Exam ? ?HPI:  ?Andrea Bailey is a 56 y.o. presenting for annual physical ? ?I reviewed the past medical history, family history, social history, surgical history, and allergies today and no changes were needed.  Please see the problem list section below in epic for further details. ? ?Past Medical History: ?Past Medical History:  ?Diagnosis Date  ? Back pain   ? buldging pain  ? Chronic neck pain   ? Contraceptive management 10/26/2013  ? Hematoma 1994  ? after birth of second child  ? Hemorrhoids   ? Hemorrhoids 10/26/2013  ? Herpes simplex without mention of complication   ? History of herpes simplex type 2 infection 10/26/2013  ? Hormone replacement therapy (HRT) 11/08/2015  ? Itching in the vaginal area 10/26/2013  ? Leg cramps 10/26/2013  ? MRSA (methicillin resistant Staphylococcus aureus)   ? URI (upper respiratory infection) 11/02/2014  ? ?Past Surgical History: ?Past Surgical History:  ?Procedure Laterality Date  ? CESAREAN SECTION  1998  ? along with tubal ligation  ? CHOLECYSTECTOMY  several yrs ago  ? COLONOSCOPY  09/05/2011  ? Procedure: COLONOSCOPY;  Surgeon: Arlyce Harman, MD;  [facility]: [facility];  Service: Endoscopy;  Laterality: N/A;  11:30  ? ENDOMETRIAL ABLATION  several yrs ago  ? LUMBAR LAMINECTOMY/DECOMPRESSION MICRODISCECTOMY  12/28/2011  ? Procedure: LUMBAR LAMINECTOMY/DECOMPRESSION MICRODISCECTOMY;  Surgeon: Reinaldo Meeker, MD;  Location: [facility];  Service: Neurosurgery;  Laterality: Right;  LUMBAR LAMINECTOMY DECOMPRESSION MICRODISCECTOMY LUMBAR 5-SACRAL ONE  ? NECK SURGERY  2011  ? NECK SURGERY  08/2016  ? ?Social History: ?Social History  ? ?Socioeconomic History  ? Marital status: Divorced  ?  Spouse name: Not on file  ? Number of children: Not on file  ? Years of education: Not on file  ? Highest education level: Not on file  ?Occupational History  ? Occupation: Emergency planning/management officer  ?Tobacco Use  ? Smoking status: Every Day  ?  Packs/day: 1.00  ?  Years:  9.00  ?  Pack years: 9.00  ?  Types: Cigarettes  ? Smokeless tobacco: Never  ?Substance and Sexual Activity  ? Alcohol use: Yes  ?  Comment: rarely  ? Drug use: No  ? Sexual activity: Yes  ?  Birth control/protection: Surgical  ?  Comment: ablation  ?Other Topics Concern  ? Not on file  ?Social History Narrative  ? Not on file  ? ?Social Determinants of Health  ? ?Financial Resource Strain: Not on file  ?Food Insecurity: Not on file  ?Transportation Needs: Not on file  ?Physical Activity: Not on file  ?Stress: Not on file  ?Social Connections: Not on file  ? ?Family History: ?Family History  ?Problem Relation Age of Onset  ? Lung cancer Mother   ? Lung cancer Father   ? Diabetes Daughter   ? Cancer Sister   ?     skin  ? COPD Sister   ? Colon cancer Neg Hx   ? Anesthesia problems Neg Hx   ? Hypotension Neg Hx   ? Malignant hyperthermia Neg Hx   ? Pseudochol deficiency Neg Hx   ? ?Allergies: ?No Known Allergies ?Medications: See med rec. ? ?Review of Systems: No headache, visual changes, nausea, vomiting, diarrhea, constipation, dizziness, abdominal pain, skin rash, fevers, chills, night sweats, swollen lymph nodes, weight loss, chest pain, body aches, joint swelling, muscle aches, shortness of breath, mood changes, visual or auditory hallucinations. ? ?Objective:   ? ?  BP 117/72   Pulse 85   Temp 98.7 ?F (37.1 ?C)   Ht 5\' 7"  (1.702 m)   Wt 163 lb 9.6 oz (74.2 kg)   SpO2 96%   BMI 25.62 kg/m?  ? ?General: Well Developed, well nourished, and in no acute distress.  ?Neuro: Alert and oriented x3, extra-ocular muscles intact, sensation grossly intact. Cranial nerves II through XII are intact, motor, sensory, and coordinative functions are all intact. ?HEENT: Normocephalic, atraumatic, pupils equal round reactive to light, neck supple, no masses, no lymphadenopathy, thyroid nonpalpable. Oropharynx, nasopharynx, external ear canals are unremarkable. ?Skin: Warm and dry, no rashes noted.  ?Cardiac: Regular rate and  rhythm, no murmurs rubs or gallops.  ?Respiratory: Clear to auscultation bilaterally. Not using accessory muscles, speaking in full sentences.  ?Abdominal: Soft, nontender, nondistended, positive bowel sounds, no masses, no organomegaly.  ?Musculoskeletal: Shoulder, elbow, wrist, hip, knee, ankle stable, and with full range of motion. ? ?Impression and Recommendations:   ? ?Wellness examination ?Routine HCM labs reviewed and discussed. HCM reviewed/discussed. Anticipatory guidance regarding healthy weight, lifestyle and choices given. ?Recommend healthy diet.  Recommend approximately 150 minutes/week of moderate intensity exercise ?Recommend regular dental and vision exams ?Always use seatbelt/lap and shoulder restraints ?Recommend using smoke alarms and checking batteries at least twice a year ?Recommend using sunscreen when outside ?Recommend shingles vaccine, patient declines at this time, citing that her sister had adverse effects from the vaccine ?Recommend colon cancer screening, she reports that this was done in the past, not sure how long ago but feels that she is due.  Referral placed for patient to have colonoscopy completed in [place] ?Patient is in need of tetanus booster, amenable to this today, administered ?Also does appear that she is due for cervical cancer screening as well as breast cancer screening, she indicates that she usually does this through her OB/GYN, plans to arrange for follow-up with him ?Given patient age and smoking history, it is recommended to have lung cancer screening completed, she declines at this time due to other imaging and testing being done and would prefer to wait until another time to do this ? ?Transaminitis ?Noted on recent labs, will proceed with additional laboratory evaluation for viral causes as well as iron storage issues.  Will also order right upper quadrant ultrasound for further evaluation ? ?Plan for follow-up in about 6 months.  Discussed lung cancer  screening, smoking cessation at that time ? ? ?___________________________________________ ?Kairyn Olmeda de , MD, ABFM, CAQSM ?Primary Care and Sports Medicine ?[facility] ?

## 2022-03-06 NOTE — Assessment & Plan Note (Signed)
Noted on recent labs, will proceed with additional laboratory evaluation for viral causes as well as iron storage issues.  Will also order right upper quadrant ultrasound for further evaluation ?

## 2022-03-07 ENCOUNTER — Ambulatory Visit (HOSPITAL_COMMUNITY)
Admission: RE | Admit: 2022-03-07 | Discharge: 2022-03-07 | Disposition: A | Discharge status: Home / Self Care | Payer: BC Managed Care – PPO | Source: Ambulatory Visit | Attending: Neurosurgery | Admitting: Neurosurgery

## 2022-03-07 DIAGNOSIS — M549 Dorsalgia, unspecified: Secondary | ICD-10-CM | POA: Diagnosis not present

## 2022-03-07 DIAGNOSIS — M5414 Radiculopathy, thoracic region: Secondary | ICD-10-CM | POA: Insufficient documentation

## 2022-03-07 LAB — IRON,TIBC AND FERRITIN PANEL
Ferritin: 486 ng/mL — ABNORMAL HIGH (ref 15–150)
Iron Saturation: 13 % — ABNORMAL LOW (ref 15–55)
Iron: 40 ug/dL (ref 27–159)
Total Iron Binding Capacity: 316 ug/dL (ref 250–450)
UIBC: 276 ug/dL (ref 131–425)

## 2022-03-07 LAB — HEPATITIS B SURFACE ANTIGEN: Hepatitis B Surface Ag: NEGATIVE

## 2022-03-07 LAB — HEPATITIS C ANTIBODY: Hep C Virus Ab: NONREACTIVE

## 2022-03-07 LAB — HEPATITIS B SURFACE ANTIBODY,QUALITATIVE: Hep B Surface Ab, Qual: NONREACTIVE

## 2022-03-12 ENCOUNTER — Encounter: Payer: Self-pay | Admitting: Internal Medicine

## 2022-03-13 ENCOUNTER — Other Ambulatory Visit: Payer: Self-pay | Admitting: Adult Health

## 2022-04-02 ENCOUNTER — Telehealth (HOSPITAL_BASED_OUTPATIENT_CLINIC_OR_DEPARTMENT_OTHER): Payer: Self-pay | Admitting: Family Medicine

## 2022-04-02 NOTE — Telephone Encounter (Signed)
Pt is requesting call from CMA to discuss medication samples that she was given. Pt states that she would like a call after she gets off at about 4 o'clock. Please advise. ?

## 2022-04-03 DIAGNOSIS — M5416 Radiculopathy, lumbar region: Secondary | ICD-10-CM | POA: Diagnosis not present

## 2022-04-03 DIAGNOSIS — Z6828 Body mass index (BMI) 28.0-28.9, adult: Secondary | ICD-10-CM | POA: Diagnosis not present

## 2022-04-03 DIAGNOSIS — M5412 Radiculopathy, cervical region: Secondary | ICD-10-CM | POA: Diagnosis not present

## 2022-04-17 NOTE — Telephone Encounter (Signed)
Pt called again today requesting to speak to [place]. Pt states that she didn't get a call back from her request on May 1st. Please advise. ?

## 2022-04-18 NOTE — Telephone Encounter (Signed)
I spoke with patient regarding Wegovy samples, she is aware that we have none and there is a Producer, television/film/video on the medication. ?

## 2022-04-18 NOTE — Telephone Encounter (Signed)
Please contact patient and document encounter. ?

## 2022-04-19 DIAGNOSIS — M542 Cervicalgia: Secondary | ICD-10-CM | POA: Diagnosis not present

## 2022-04-19 DIAGNOSIS — M5412 Radiculopathy, cervical region: Secondary | ICD-10-CM | POA: Diagnosis not present

## 2022-04-19 DIAGNOSIS — M5416 Radiculopathy, lumbar region: Secondary | ICD-10-CM | POA: Diagnosis not present

## 2022-04-19 DIAGNOSIS — M545 Low back pain, unspecified: Secondary | ICD-10-CM | POA: Diagnosis not present

## 2022-04-19 DIAGNOSIS — M79604 Pain in right leg: Secondary | ICD-10-CM | POA: Diagnosis not present

## 2022-05-03 HISTORY — PX: NECK SURGERY: SHX720

## 2022-05-08 ENCOUNTER — Encounter: Payer: Self-pay | Admitting: *Deleted

## 2022-05-15 ENCOUNTER — Telehealth: Payer: Self-pay | Admitting: *Deleted

## 2022-05-15 ENCOUNTER — Ambulatory Visit: Payer: BC Managed Care – PPO

## 2022-05-15 DIAGNOSIS — M5412 Radiculopathy, cervical region: Secondary | ICD-10-CM | POA: Diagnosis not present

## 2022-05-15 DIAGNOSIS — M47816 Spondylosis without myelopathy or radiculopathy, lumbar region: Secondary | ICD-10-CM | POA: Diagnosis not present

## 2022-05-15 DIAGNOSIS — M5416 Radiculopathy, lumbar region: Secondary | ICD-10-CM | POA: Diagnosis not present

## 2022-05-15 NOTE — Telephone Encounter (Signed)
Referring MD/PCP: Dr. Ihor Dow  Procedure: Colonoscopy  Per questionnaire checked for blood in stool  Has patient had this procedure before?  Dr. Darrick Penna, 09/05/2011  If so, when, by whom and where?    Is there a family history of colon cancer?  no  Who?  What age when diagnosed?    Is patient diabetic? If yes, Type 1 or Type 2   no      Does patient have prosthetic heart valve or mechanical valve?  no  Do you have a pacemaker/defibrillator?  no  Has patient ever had endocarditis/atrial fibrillation? no  Does patient use oxygen? no  Has patient had joint replacement within last 12 months?  no  Is patient constipated or do they take laxatives? no  Does patient have a history of alcohol/drug use?  no  Have you had a stroke/heart attack last 6 mths? no  Do you take medicine for weight loss?  yes  For female patients,: have you had a hysterectomy no                                          do you still have your menstrual cycle no  Is patient on blood thinner such as Coumadin, Plavix and/or Aspirin? no  Medications:  Current Outpatient Medications on File Prior to Visit  Medication Sig Dispense Refill   clobetasol cream (TEMOVATE) 0.05 % APPLY 1 APPLICATION TOPICALLY AS NEEDED. 30 g 1   FYAVOLV 1-5 MG-MCG TABS tablet TAKE 1 TABLET BY MOUTH EVERY DAY 84 tablet 0   gabapentin (NEURONTIN) 100 MG capsule takes 300 mg at bedtime  0   HYDROcodone-acetaminophen (NORCO/VICODIN) 5-325 MG tablet Take 1 tablet by mouth every 6 (six) hours as needed for moderate pain. May take with ibuprofen (Patient not taking: Reported on 04/21/2018) 15 tablet 0   predniSONE (DELTASONE) 20 MG tablet Take 2 tablets daily with breakfast. (Patient not taking: Reported on 03/06/2022) 10 tablet 0   tiZANidine (ZANAFLEX) 4 MG tablet Take 1 tablet (4 mg total) by mouth at bedtime. (Patient not taking: Reported on 03/06/2022) 30 tablet 0   valACYclovir (VALTREX) 1000 MG tablet TAKE 1 TABLET BY MOUTH EVERY DAY 90  tablet 2   No current facility-administered medications on file prior to visit.     Allergies: No Known Allergies

## 2022-05-28 DIAGNOSIS — M4802 Spinal stenosis, cervical region: Secondary | ICD-10-CM | POA: Diagnosis not present

## 2022-05-30 DIAGNOSIS — M6588 Other synovitis and tenosynovitis, other site: Secondary | ICD-10-CM | POA: Diagnosis not present

## 2022-05-30 DIAGNOSIS — Z9889 Other specified postprocedural states: Secondary | ICD-10-CM | POA: Diagnosis not present

## 2022-05-30 DIAGNOSIS — M4802 Spinal stenosis, cervical region: Secondary | ICD-10-CM | POA: Diagnosis not present

## 2022-05-30 DIAGNOSIS — Z96698 Presence of other orthopedic joint implants: Secondary | ICD-10-CM | POA: Diagnosis not present

## 2022-05-30 DIAGNOSIS — M2578 Osteophyte, vertebrae: Secondary | ICD-10-CM | POA: Diagnosis not present

## 2022-06-07 ENCOUNTER — Other Ambulatory Visit: Payer: Self-pay | Admitting: Adult Health

## 2022-06-18 ENCOUNTER — Other Ambulatory Visit: Payer: Self-pay | Admitting: Adult Health

## 2022-06-27 ENCOUNTER — Encounter: Payer: Self-pay | Admitting: *Deleted

## 2022-06-27 MED ORDER — NA SULFATE-K SULFATE-MG SULF 17.5-3.13-1.6 GM/177ML PO SOLN: ORAL | 0 refills | Status: DC

## 2022-07-09 ENCOUNTER — Other Ambulatory Visit: Payer: Self-pay | Admitting: Adult Health

## 2022-07-11 ENCOUNTER — Encounter (HOSPITAL_COMMUNITY)
Admission: RE | Admit: 2022-07-11 | Discharge: 2022-07-11 | Disposition: A | Discharge status: Home / Self Care | Payer: BC Managed Care – PPO | Source: Ambulatory Visit | Attending: Internal Medicine | Admitting: Internal Medicine

## 2022-07-11 DIAGNOSIS — M4802 Spinal stenosis, cervical region: Secondary | ICD-10-CM | POA: Diagnosis not present

## 2022-07-12 ENCOUNTER — Telehealth: Payer: Self-pay | Admitting: Adult Health

## 2022-07-12 ENCOUNTER — Other Ambulatory Visit: Payer: Self-pay | Admitting: *Deleted

## 2022-07-12 MED ORDER — NORETHINDRONE-ETH ESTRADIOL 1-5 MG-MCG PO TABS
1.0000 | ORAL_TABLET | Freq: Every day | ORAL | 1 refills | Status: DC
Start: 2022-07-12 — End: 2022-08-23

## 2022-07-12 NOTE — Telephone Encounter (Signed)
I just spoke to patient scheduled her appt but it's not until sept 21st  and she states that she is almost out of fyavolv if you could send enough to last until then sent to [facility] [address]

## 2022-07-12 NOTE — Telephone Encounter (Signed)
Refill request to [facility].

## 2022-07-13 ENCOUNTER — Ambulatory Visit (HOSPITAL_COMMUNITY): Payer: BC Managed Care – PPO | Admitting: Anesthesiology

## 2022-07-13 ENCOUNTER — Ambulatory Visit (HOSPITAL_COMMUNITY)
Admission: RE | Admit: 2022-07-13 | Discharge: 2022-07-13 | Disposition: A | Discharge status: Home / Self Care | Payer: BC Managed Care – PPO | Attending: Internal Medicine | Admitting: Internal Medicine

## 2022-07-13 ENCOUNTER — Encounter (HOSPITAL_COMMUNITY)
Admission: RE | Disposition: A | Discharge status: Home / Self Care | Payer: Self-pay | Source: Home / Self Care | Attending: Internal Medicine

## 2022-07-13 ENCOUNTER — Encounter (HOSPITAL_COMMUNITY): Payer: Self-pay

## 2022-07-13 DIAGNOSIS — F17210 Nicotine dependence, cigarettes, uncomplicated: Secondary | ICD-10-CM | POA: Diagnosis not present

## 2022-07-13 DIAGNOSIS — K648 Other hemorrhoids: Secondary | ICD-10-CM | POA: Diagnosis not present

## 2022-07-13 DIAGNOSIS — Z1212 Encounter for screening for malignant neoplasm of rectum: Secondary | ICD-10-CM

## 2022-07-13 DIAGNOSIS — Z1211 Encounter for screening for malignant neoplasm of colon: Secondary | ICD-10-CM | POA: Diagnosis not present

## 2022-07-13 HISTORY — PX: COLONOSCOPY WITH PROPOFOL: SHX5780

## 2022-07-13 SURGERY — COLONOSCOPY WITH PROPOFOL
Anesthesia: General

## 2022-07-13 MED ORDER — LACTATED RINGERS IV SOLN
INTRAVENOUS | Status: DC
  Administered 2022-07-13: 1000 mL via INTRAVENOUS

## 2022-07-13 MED ORDER — PROPOFOL 500 MG/50ML IV EMUL
INTRAVENOUS | Status: DC | PRN
  Administered 2022-07-13: 200 ug/kg/min via INTRAVENOUS

## 2022-07-13 MED ORDER — LIDOCAINE HCL (CARDIAC) PF 100 MG/5ML IV SOSY
PREFILLED_SYRINGE | INTRAVENOUS | Status: DC | PRN
  Administered 2022-07-13: 50 mg via INTRAVENOUS

## 2022-07-13 MED ORDER — PROPOFOL 10 MG/ML IV BOLUS
INTRAVENOUS | Status: DC | PRN
  Administered 2022-07-13: 50 mg via INTRAVENOUS
  Administered 2022-07-13: 20 mg via INTRAVENOUS

## 2022-07-13 NOTE — Discharge Instructions (Signed)
  Colonoscopy Discharge Instructions  Read the instructions outlined below and refer to this sheet in the next few weeks. These discharge instructions provide you with general information on caring for yourself after you leave the hospital. Your doctor may also give you specific instructions. While your treatment has been planned according to the most current medical practices available, unavoidable complications occasionally occur.   ACTIVITY You may resume your regular activity, but move at a slower pace for the next 24 hours.  Take frequent rest periods for the next 24 hours.  Walking will help get rid of the air and reduce the bloated feeling in your belly (abdomen).  No driving for 24 hours (because of the medicine (anesthesia) used during the test).   Do not sign any important legal documents or operate any machinery for 24 hours (because of the anesthesia used during the test).  NUTRITION Drink plenty of fluids.  You may resume your normal diet as instructed by your doctor.  Begin with a light meal and progress to your normal diet. Heavy or fried foods are harder to digest and may make you feel sick to your stomach (nauseated).  Avoid alcoholic beverages for 24 hours or as instructed.  MEDICATIONS You may resume your normal medications unless your doctor tells you otherwise.  WHAT YOU CAN EXPECT TODAY Some feelings of bloating in the abdomen.  Passage of more gas than usual.  Spotting of blood in your stool or on the toilet paper.  IF YOU HAD POLYPS REMOVED DURING THE COLONOSCOPY: No aspirin products for 7 days or as instructed.  No alcohol for 7 days or as instructed.  Eat a soft diet for the next 24 hours.  FINDING OUT THE RESULTS OF YOUR TEST Not all test results are available during your visit. If your test results are not back during the visit, make an appointment with your caregiver to find out the results. Do not assume everything is normal if you have not heard from your  caregiver or the medical facility. It is important for you to follow up on all of your test results.  SEEK IMMEDIATE MEDICAL ATTENTION IF: You have more than a spotting of blood in your stool.  Your belly is swollen (abdominal distention).  You are nauseated or vomiting.  You have a temperature over 101.  You have abdominal pain or discomfort that is severe or gets worse throughout the day.   Your colonoscopy was relatively unremarkable.  I did not find any polyps or evidence of colon cancer.  I recommend repeating colonoscopy in 10 years for colon cancer screening purposes.  You do have internal and external hemorrhoids. I would recommend increasing fiber in your diet or adding OTC Benefiber/Metamucil. Be sure to drink at least 4 to 6 glasses of water daily. Follow-up with GI as needed.   I hope you have a great rest of your week!  Hennie Duos. Marletta Lor, D.O. Gastroenterology and Hepatology [facility]

## 2022-07-13 NOTE — H&P (Signed)
Primary Care Physician:  [place], Buren Kos, MD Primary Gastroenterologist:  Dr. Marletta Lor  Pre-Procedure History & Physical: HPI:  Andrea Bailey is a 56 y.o. female is here for a colonoscopy for colon cancer screening purposes.  Patient denies any family history of colorectal cancer.  No melena or hematochezia.  No abdominal pain or unintentional weight loss.  No change in bowel habits.  Overall feels well from a GI standpoint.  Past Medical History:  Diagnosis Date   Back pain    buldging pain   Chronic neck pain    Contraceptive management 10/26/2013   Hematoma 1994   after birth of second child   Hemorrhoids    Hemorrhoids 10/26/2013   Herpes simplex without mention of complication    History of herpes simplex type 2 infection 10/26/2013   Hormone replacement therapy (HRT) 11/08/2015   Itching in the vaginal area 10/26/2013   Leg cramps 10/26/2013   MRSA (methicillin resistant Staphylococcus aureus)    URI (upper respiratory infection) 11/02/2014    Past Surgical History:  Procedure Laterality Date   CESAREAN SECTION  1998   along with tubal ligation   CHOLECYSTECTOMY  several yrs ago   COLONOSCOPY  09/05/2011   Procedure: COLONOSCOPY;  Surgeon: Arlyce Harman, MD;  Location: [facility];  Service: Endoscopy;  Laterality: N/A;  11:30   ENDOMETRIAL ABLATION  several yrs ago   LUMBAR LAMINECTOMY/DECOMPRESSION MICRODISCECTOMY  12/28/2011   Procedure: LUMBAR LAMINECTOMY/DECOMPRESSION MICRODISCECTOMY;  Surgeon: Reinaldo Meeker, MD;  Location: [facility];  Service: Neurosurgery;  Laterality: Right;  LUMBAR LAMINECTOMY DECOMPRESSION MICRODISCECTOMY LUMBAR 5-SACRAL ONE   NECK SURGERY  2011   NECK SURGERY  08/2016    Prior to Admission medications   Medication Sig Start Date End Date Taking? Authorizing Provider  Magnesium 500 MG TABS Take 500 mg by mouth in the morning.   Yes [provider]  Na Sulfate-K Sulfate-Mg Sulf 17.5-3.13-1.6 GM/177ML SOLN As directed 06/27/22  Yes  Feras Gardella, Hennie Duos, DO  norethindrone-ethinyl estradiol (FYAVOLV) 1-5 MG-MCG TABS tablet Take 1 tablet by mouth daily. 07/12/22  Yes Adline Potter, NP  valACYclovir (VALTREX) 1000 MG tablet TAKE 1 TABLET BY MOUTH EVERY DAY 06/18/22  Yes Adline Potter, NP    Allergies as of 06/27/2022   (No Known Allergies)    Family History  Problem Relation Age of Onset   Lung cancer Mother    Lung cancer Father    Diabetes Daughter    Cancer Sister        skin   COPD Sister    Colon cancer Neg Hx    Anesthesia problems Neg Hx    Hypotension Neg Hx    Malignant hyperthermia Neg Hx    Pseudochol deficiency Neg Hx     Social History   Socioeconomic History   Marital status: Divorced    Spouse name: Not on file   Number of children: Not on file   Years of education: Not on file   Highest education level: Not on file  Occupational History   Occupation: Emergency planning/management officer  Tobacco Use   Smoking status: Every Day    Packs/day: 1.00    Years: 9.00    Total pack years: 9.00    Types: Cigarettes   Smokeless tobacco: Never  Substance and Sexual Activity   Alcohol use: Yes    Comment: rarely   Drug use: No   Sexual activity: Yes    Birth control/protection: Surgical  Comment: ablation  Other Topics Concern   Not on file  Social History Narrative   Not on file   Social Determinants of Health   Financial Resource Strain: Not on file  Food Insecurity: Not on file  Transportation Needs: Not on file  Physical Activity: Not on file  Stress: Not on file  Social Connections: Not on file  Intimate Partner Violence: Not on file    Review of Systems: See HPI, otherwise negative ROS  Physical Exam: Vital signs in last 24 hours: Temp:  [97.6 F (36.4 C)] 97.6 F (36.4 C) (08/11 1213) Resp:  [22] 22 (08/11 1213) BP: (143)/(83) 143/83 (08/11 1213) SpO2:  [97 %] 97 % (08/11 1213) Weight:  [64.4 kg] 64.4 kg (08/11 1213)   General:   Alert,  Well-developed, well-nourished, pleasant and  cooperative in NAD Head:  Normocephalic and atraumatic. Eyes:  Sclera clear, no icterus.   Conjunctiva pink. Ears:  Normal auditory acuity. Nose:  No deformity, discharge,  or lesions. Mouth:  No deformity or lesions, dentition normal. Neck:  Supple; no masses or thyromegaly. Lungs:  Clear throughout to auscultation.   No wheezes, crackles, or rhonchi. No acute distress. Heart:  Regular rate and rhythm; no murmurs, clicks, rubs,  or gallops. Abdomen:  Soft, nontender and nondistended. No masses, hepatosplenomegaly or hernias noted. Normal bowel sounds, without guarding, and without rebound.   Msk:  Symmetrical without gross deformities. Normal posture. Extremities:  Without clubbing or edema. Neurologic:  Alert and  oriented x4;  grossly normal neurologically. Skin:  Intact without significant lesions or rashes. Cervical Nodes:  No significant cervical adenopathy. Psych:  Alert and cooperative. Normal mood and affect.  Impression/Plan: Andrea Bailey is here for a colonoscopy to be performed for colon cancer screening purposes.  The risks of the procedure including infection, bleed, or perforation as well as benefits, limitations, alternatives and imponderables have been reviewed with the patient. Questions have been answered. All parties agreeable.

## 2022-07-13 NOTE — Anesthesia Preprocedure Evaluation (Addendum)
Anesthesia Evaluation  Patient identified by MRN, date of birth, ID band Patient awake    Reviewed: Allergy & Precautions, NPO status , Patient's Chart, lab work & pertinent test results  Airway Mallampati: II  TM Distance: >3 FB Neck ROM: Full   Comment: Neck sx Dental  (+) Dental Advisory Given, Teeth Intact   Pulmonary Current Smoker and Patient abstained from smoking.,    Pulmonary exam normal breath sounds clear to auscultation       Cardiovascular negative cardio ROS Normal cardiovascular exam Rhythm:Regular Rate:Normal     Neuro/Psych negative neurological ROS  negative psych ROS   GI/Hepatic negative GI ROS, Neg liver ROS,   Endo/Other  negative endocrine ROS  Renal/GU negative Renal ROS  negative genitourinary   Musculoskeletal negative musculoskeletal ROS (+)   Abdominal   Peds negative pediatric ROS (+)  Hematology negative hematology ROS (+)   Anesthesia Other Findings   Reproductive/Obstetrics negative OB ROS                            Anesthesia Physical Anesthesia Plan  ASA: 2  Anesthesia Plan: General   Post-op Pain Management: Minimal or no pain anticipated   Induction: Intravenous  PONV Risk Score and Plan: Propofol infusion  Airway Management Planned: Nasal Cannula and Natural Airway  Additional Equipment:   Intra-op Plan:   Post-operative Plan:   Informed Consent: I have reviewed the patients History and Physical, chart, labs and discussed the procedure including the risks, benefits and alternatives for the proposed anesthesia with the patient or authorized representative who has indicated his/her understanding and acceptance.     Dental advisory given  Plan Discussed with: CRNA and Surgeon  Anesthesia Plan Comments:         Anesthesia Quick Evaluation

## 2022-07-13 NOTE — Anesthesia Postprocedure Evaluation (Signed)
Anesthesia Post Note  Patient: Andrea Bailey  Procedure(s) Performed: COLONOSCOPY WITH PROPOFOL  Patient location during evaluation: Phase II Anesthesia Type: General Level of consciousness: awake and alert and oriented Pain management: pain level controlled Vital Signs Assessment: post-procedure vital signs reviewed and stable Respiratory status: spontaneous breathing, nonlabored ventilation and respiratory function stable Cardiovascular status: blood pressure returned to baseline and stable Postop Assessment: no apparent nausea or vomiting Anesthetic complications: no   No notable events documented.   Last Vitals:  Vitals:   07/13/22 1213 07/13/22 1305  BP: (!) 143/83 107/65  Pulse:  79  Resp: (!) 22 18  Temp: 36.4 C 36.4 C  SpO2: 97% 100%    Last Pain:  Vitals:   07/13/22 1305  TempSrc: Oral  PainSc: 0-No pain                 Iriel Nason C Charna Neeb

## 2022-07-13 NOTE — Op Note (Signed)
[facility] Patient Name: Andrea Bailey Procedure Date: 07/13/2022 12:33 PM MRN: [id number] Date of Birth: 1966-05-29 Attending MD: Elon Alas. Abbey Chatters DO CSN: [id number] Age: 56 Admit Type: Outpatient Procedure:                Colonoscopy Indications:              Screening for colorectal malignant neoplasm Providers:                Elon Alas. Abbey Chatters, DO, Caprice Kluver, [place]                            Risa Grill, Technician, Bethel Born,                            [employer]chant [employer] officer Referring MD:              Medicines:                See the Anesthesia note for documentation of the                            administered medications Complications:            No immediate complications. Estimated Blood Loss:     Estimated blood loss: none. Procedure:                Pre-Anesthesia Assessment:                           - The anesthesia plan was to use monitored                            anesthesia care (MAC).                           After obtaining informed consent, the colonoscope                            was passed under direct vision. Throughout the                            procedure, the patient's blood pressure, pulse, and                            oxygen saturations were monitored continuously. The                            PCF-HQ190L ([id number]) scope was introduced through                            the anus and advanced to the the cecum, identified                            by appendiceal orifice and ileocecal valve. The                            colonoscopy was performed without difficulty. The  patient tolerated the procedure well. The quality                            of the bowel preparation was evaluated using the                            BBPS [facility] Bowel Preparation Scale) with scores                            of: Right Colon = 3, Transverse Colon = 3 and Left                            Colon = 3 (entire mucosa seen well  with no residual                            staining, small fragments of stool or opaque                            liquid). The total BBPS score equals 9. Scope In: 12:51:13 PM Scope Out: 1:00:05 PM Scope Withdrawal Time: 0 hours 6 minutes 6 seconds  Total Procedure Duration: 0 hours 8 minutes 52 seconds  Findings:      Hemorrhoids were found on perianal exam.      Non-bleeding internal hemorrhoids were found during endoscopy.      The exam was otherwise without abnormality. Impression:               - Hemorrhoids found on perianal exam.                           - Non-bleeding internal hemorrhoids.                           - The examination was otherwise normal.                           - No specimens collected. Moderate Sedation:      Per Anesthesia Care Recommendation:           - Patient has a contact number available for                            emergencies. The signs and symptoms of potential                            delayed complications were discussed with the                            patient. Return to normal activities tomorrow.                            Written discharge instructions were provided to the                            patient.                           -  Resume previous diet.                           - Continue present medications.                           - Repeat colonoscopy in 10 years for screening                            purposes.                           - Return to GI clinic PRN. Procedure Code(s):        --- Professional ---                           D3267, Colorectal cancer screening; colonoscopy on                            individual not meeting criteria for high risk Diagnosis Code(s):        --- Professional ---                           Z12.11, Encounter for screening for malignant                            neoplasm of colon                           K64.8, Other hemorrhoids CPT copyright 2019 American Medical Association. All  rights reserved. The codes documented in this report are preliminary and upon coder review may  be revised to meet current compliance requirements. Elon Alas. Abbey Chatters, DO [place] Abbey Chatters, DO 07/13/2022 1:02:34 PM This report has been signed electronically. Number of Addenda: 0

## 2022-07-13 NOTE — Transfer of Care (Signed)
Immediate Anesthesia Transfer of Care Note  Patient: Andrea Bailey  Procedure(s) Performed: COLONOSCOPY WITH PROPOFOL  Patient Location: PACU  Anesthesia Type:MAC  Level of Consciousness: awake and patient cooperative  Airway & Oxygen Therapy: Patient Spontanous Breathing  Post-op Assessment: Report given to RN and Post -op Vital signs reviewed and stable  Post vital signs: Reviewed and stable  Last Vitals:  Vitals Value Taken Time  BP 107/65 07/13/22 1305  Temp 36.4 C 07/13/22 1305  Pulse 79 07/13/22 1305  Resp 18 07/13/22 1305  SpO2 100 % 07/13/22 1305    Last Pain:  Vitals:   07/13/22 1305  TempSrc: Oral  PainSc: 0-No pain      Patients Stated Pain Goal: 7 (67/42/55 2589)  Complications: No notable events documented.

## 2022-07-18 DIAGNOSIS — C44722 Squamous cell carcinoma of skin of right lower limb, including hip: Secondary | ICD-10-CM | POA: Diagnosis not present

## 2022-07-18 DIAGNOSIS — L308 Other specified dermatitis: Secondary | ICD-10-CM | POA: Diagnosis not present

## 2022-07-20 ENCOUNTER — Encounter (HOSPITAL_COMMUNITY): Payer: Self-pay | Admitting: Internal Medicine

## 2022-08-02 DIAGNOSIS — M47816 Spondylosis without myelopathy or radiculopathy, lumbar region: Secondary | ICD-10-CM | POA: Diagnosis not present

## 2022-08-15 DIAGNOSIS — M4802 Spinal stenosis, cervical region: Secondary | ICD-10-CM | POA: Diagnosis not present

## 2022-08-23 ENCOUNTER — Other Ambulatory Visit (HOSPITAL_COMMUNITY)
Admission: RE | Admit: 2022-08-23 | Discharge: 2022-08-23 | Disposition: A | Discharge status: Home / Self Care | Payer: BC Managed Care – PPO | Source: Ambulatory Visit | Attending: Adult Health | Admitting: Adult Health

## 2022-08-23 ENCOUNTER — Encounter: Payer: Self-pay | Admitting: Adult Health

## 2022-08-23 ENCOUNTER — Ambulatory Visit (INDEPENDENT_AMBULATORY_CARE_PROVIDER_SITE_OTHER): Payer: BC Managed Care – PPO | Admitting: Adult Health

## 2022-08-23 VITALS — BP 120/69 | HR 70 | Ht 65.25 in | Wt 149.0 lb

## 2022-08-23 DIAGNOSIS — Z01419 Encounter for gynecological examination (general) (routine) without abnormal findings: Secondary | ICD-10-CM | POA: Insufficient documentation

## 2022-08-23 DIAGNOSIS — Z1211 Encounter for screening for malignant neoplasm of colon: Secondary | ICD-10-CM | POA: Insufficient documentation

## 2022-08-23 DIAGNOSIS — Z1231 Encounter for screening mammogram for malignant neoplasm of breast: Secondary | ICD-10-CM

## 2022-08-23 DIAGNOSIS — Z79890 Hormone replacement therapy (postmenopausal): Secondary | ICD-10-CM

## 2022-08-23 DIAGNOSIS — K649 Unspecified hemorrhoids: Secondary | ICD-10-CM

## 2022-08-23 HISTORY — DX: Encounter for screening mammogram for malignant neoplasm of breast: Z12.31

## 2022-08-23 HISTORY — DX: Encounter for screening for malignant neoplasm of colon: Z12.11

## 2022-08-23 LAB — HEMOCCULT GUIAC POC 1CARD (OFFICE): Fecal Occult Blood, POC: NEGATIVE

## 2022-08-23 MED ORDER — NORETHINDRONE-ETH ESTRADIOL 1-5 MG-MCG PO TABS: 1.0000 | ORAL_TABLET | Freq: Every day | ORAL | 3 refills | Status: DC

## 2022-08-23 MED ORDER — VALACYCLOVIR HCL 1 G PO TABS: 1000.0000 mg | ORAL_TABLET | Freq: Every day | ORAL | 3 refills | Status: DC

## 2022-08-23 NOTE — Progress Notes (Signed)
Patient ID: Andrea Bailey, female   DOB: 1966/07/22, 56 y.o.   MRN: [id number] History of Present Illness: Andrea Bailey is a 56 year old white female, divorced, PM in for a  gyn exam and pap. She had neck surgery this June and is still out of work.  She had a physical 03/06/22 with PCP.  PCP is Dr Tommi Rumps [place]  Current Medications, Allergies, Past Medical History, Past Surgical History, Family History and Social History were reviewed in [employer] electronic medical record.     Review of Systems: Patient denies any headaches, hearing loss, fatigue, blurred vision, shortness of breath, chest pain, abdominal pain, problems with bowel movements, urination, or intercourse. No joint pain or mood swings.  Denies any vaginal bleeding, no hot flashes  Has occasion RLQ pain   Physical Exam:BP 120/69 (BP Location: Left Arm, Patient Position: Sitting, Cuff Size: Normal)   Pulse 70   Ht 5' 5.25" (1.657 m)   Wt 149 lb (67.6 kg)   BMI 24.61 kg/m   General:  Well developed, well nourished, no acute distress Skin:  Warm and dry Neck:  Midline trachea, normal thyroid, good ROM, no lymphadenopathy Lungs; Clear to auscultation bilaterally Breast:  No dominant palpable mass, retraction, or nipple discharge Cardiovascular: Regular rate and rhythm Abdomen:  Soft, non tender, no hepatosplenomegaly Pelvic:  External genitalia is normal in appearance, no lesions.  The vagina is pale pink. Urethra has no lesions or masses. The cervix is smooth, pap with HR HPV genotyping performed.  Uterus is felt to be normal size, shape, and contour.  No adnexal masses or tenderness noted.Bladder is non tender, no masses felt. Rectal: Good sphincter tone, no polyps, + hemorrhoids felt.  Hemoccult negative. Extremities/musculoskeletal:  No swelling or varicosities noted, no clubbing or cyanosis, she has scab top right foot sp skin cancer removal and she says it hurts, to call MD Psych:  No mood changes, alert and cooperative,seems  happy AA is 2  Fall risk is low    08/23/2022    3:47 PM 03/06/2022    3:56 PM 02/17/2018    9:00 AM  Depression screen PHQ 2/9  Decreased Interest 0 0 0  Down, Depressed, Hopeless 0 0 0  PHQ - 2 Score 0 0 0  Altered sleeping 1  1  Tired, decreased energy 0  1  Change in appetite 0  0  Feeling bad or failure about yourself  0    Trouble concentrating 0  0  Moving slowly or fidgety/restless 0  0  Suicidal thoughts 0  0  PHQ-9 Score 1  2  Difficult doing work/chores   Not difficult at all       08/23/2022    3:48 PM  GAD 7 : Generalized Anxiety Score  Nervous, Anxious, on Edge 0  Control/stop worrying 0  Worry too much - different things 0  Trouble relaxing 0  Restless 0  Easily annoyed or irritable 0  Afraid - awful might happen 0  Total GAD 7 Score 0      Upstream - 08/23/22 1545       Pregnancy Intention Screening   Does the patient want to become pregnant in the next year? N/A    Does the patient's partner want to become pregnant in the next year? N/A    Would the patient like to discuss contraceptive options today? N/A      Contraception Wrap Up   Current Method Female Sterilization   ablation   End  Method Female Sterilization   ablation   Contraception Counseling Provided No            Examination chaperoned by Levy Pupa LPN   Impression and Plan: 1. Encounter for gynecological examination with Papanicolaou smear of cervix Pap sent  Pap in 3 years if normal Physical with PCP - Cytology - PAP( [facility]) Had colonoscopy 07/2022 She requests refill on valtrex too   2. Encounter for screening fecal occult blood testing Hemoccult was negative - POCT occult blood stool  3. Screening mammogram for breast cancer Mammogram scheduled for her 08/27/22 at 8:30 am at [place]; Future  4. Hormone replacement therapy (HRT) Denies any hot flashes while taking  Follow up yearly on HRT  Meds ordered this encounter   Medications   norethindrone-ethinyl estradiol (FYAVOLV) 1-5 MG-MCG TABS tablet    Sig: Take 1 tablet by mouth daily.    Dispense:  84 tablet    Refill:  3    Order Specific Question:   Supervising Provider    Answer:   Tania Ade H [[id number]]   valACYclovir (VALTREX) 1000 MG tablet    Sig: Take 1 tablet (1,000 mg total) by mouth daily.    Dispense:  90 tablet    Refill:  3    Order Specific Question:   Supervising Provider    Answer:   Elonda Husky, LUTHER H [[id number]]     5. Hemorrhoids, unspecified hemorrhoid type Has had for 34 years

## 2022-08-27 ENCOUNTER — Ambulatory Visit (HOSPITAL_COMMUNITY)
Admission: RE | Admit: 2022-08-27 | Discharge: 2022-08-27 | Disposition: A | Discharge status: Home / Self Care | Payer: BC Managed Care – PPO | Source: Ambulatory Visit | Attending: Adult Health | Admitting: Adult Health

## 2022-08-27 DIAGNOSIS — Z1231 Encounter for screening mammogram for malignant neoplasm of breast: Secondary | ICD-10-CM | POA: Insufficient documentation

## 2022-08-29 LAB — CYTOLOGY - PAP
Comment: NEGATIVE
Diagnosis: UNDETERMINED — AB
High risk HPV: NEGATIVE

## 2022-08-30 ENCOUNTER — Encounter: Payer: Self-pay | Admitting: Adult Health

## 2022-08-30 DIAGNOSIS — R87610 Atypical squamous cells of undetermined significance on cytologic smear of cervix (ASC-US): Secondary | ICD-10-CM

## 2022-08-30 HISTORY — DX: Atypical squamous cells of undetermined significance on cytologic smear of cervix (ASC-US): R87.610

## 2022-09-12 ENCOUNTER — Encounter (HOSPITAL_BASED_OUTPATIENT_CLINIC_OR_DEPARTMENT_OTHER): Payer: Self-pay | Admitting: Family Medicine

## 2022-09-12 ENCOUNTER — Ambulatory Visit (HOSPITAL_BASED_OUTPATIENT_CLINIC_OR_DEPARTMENT_OTHER): Payer: BC Managed Care – PPO | Admitting: Family Medicine

## 2022-09-12 VITALS — BP 135/62 | HR 78 | Ht 65.25 in | Wt 153.4 lb

## 2022-09-12 DIAGNOSIS — M67431 Ganglion, right wrist: Secondary | ICD-10-CM

## 2022-09-12 DIAGNOSIS — R7401 Elevation of levels of liver transaminase levels: Secondary | ICD-10-CM | POA: Diagnosis not present

## 2022-09-12 NOTE — Patient Instructions (Signed)
  Medication Instructions:  Your physician recommends that you continue on your current medications as directed. Please refer to the Current Medication list given to you today. --If you need a refill on any your medications before your next appointment, please call your pharmacy first. If no refills are authorized on file call the office.-- Lab Work: Your physician has recommended that you have lab work today: No If you have labs (blood work) drawn today and your tests are completely normal, you will receive your results via [place] a phone call from our staff.  Please ensure you check your voicemail in the event that you authorized detailed messages to be left on a delegated number. If you have any lab test that is abnormal or we need to change your treatment, we will call you to review the results.  Referrals/Procedures/Imaging: No  Follow-Up: Your next appointment:   Your physician recommends that you schedule a follow-up appointment in: 1-2 weeks for ganglion cyst aspiration with [place] with Dr. de [place]. 6 weeks follow-up office.  You will receive a text message or e-mail with a link to a survey about your care and experience with [place] today! We would greatly appreciate your feedback!   Thanks for letting us be apart of your health journey!!  Primary Care and Sports Medicine   Dr. Arlina Robes [place]   We encourage you to activate your patient portal called "MyChart".  Sign up information is provided on this After Visit Summary.  MyChart is used to connect with patients for Virtual Visits (Telemedicine).  Patients are able to view lab/test results, encounter notes, upcoming appointments, etc.  Non-urgent messages can be sent to your provider as well. To learn more about what you can do with MyChart, please visit --  [email or web address].

## 2022-09-12 NOTE — Progress Notes (Signed)
    Procedures performed today:    None.  Independent interpretation of notes and tests performed by another provider:   None.  Brief History, Exam, Impression, and Recommendations:    BP 135/62   Pulse 78   Ht 5' 5.25" (1.657 m)   Wt 153 lb 6.4 oz (69.6 kg)   SpO2 100%   BMI 25.33 kg/m   Transaminitis Elevation of liver enzymes on prior labs.  She did have laboratory testing completed which did not show evidence of underlying hepatitis or iron storage disease.  We had ordered ultrasound to be completed as well, however she has not completed as of yet Discussed recommendation to proceed with right upper quadrant ultrasound to further assess liver structure and surrounding organs.  Discussed likelihood that elevated liver enzymes are likely related to fatty liver disease and that ultrasound will help with further evaluation and to rule out other potential causes.  Patient is amenable, new order for right upper quadrant ultrasound placed today  Ganglion cyst of wrist, right Continues to have notable swelling over radial aspect of right volar wrist.  Due to prominence and continued persistence of the area, she feels that she would like to have it drained.  Discussed that we can do this in the office and would plan to do so under ultrasound guidance.  Discussed potential risk related to procedure, also discussed that there is a chance the area could recur with fluid swelling returning to the cyst sac.  Discussed option of excision as well which can be completed with specialist.  She would like to proceed with initial aspiration and monitor progress with this.  We will arrange for procedure visit in the near future to aspirate ganglion cyst  Return in about 6 weeks (around 10/24/2022) for follow-up after procedure.   ___________________________________________ Nadalie Laughner de [place], MD, ABFM, [facility] and [place]

## 2022-09-13 ENCOUNTER — Other Ambulatory Visit: Payer: Self-pay | Admitting: Adult Health

## 2022-09-13 NOTE — Assessment & Plan Note (Signed)
Continues to have notable swelling over radial aspect of right volar wrist.  Due to prominence and continued persistence of the area, she feels that she would like to have it drained.  Discussed that we can do this in the office and would plan to do so under ultrasound guidance.  Discussed potential risk related to procedure, also discussed that there is a chance the area could recur with fluid swelling returning to the cyst sac.  Discussed option of excision as well which can be completed with specialist.  She would like to proceed with initial aspiration and monitor progress with this.  We will arrange for procedure visit in the near future to aspirate ganglion cyst

## 2022-09-13 NOTE — Assessment & Plan Note (Signed)
Elevation of liver enzymes on prior labs.  She did have laboratory testing completed which did not show evidence of underlying hepatitis or iron storage disease.  We had ordered ultrasound to be completed as well, however she has not completed as of yet Discussed recommendation to proceed with right upper quadrant ultrasound to further assess liver structure and surrounding organs.  Discussed likelihood that elevated liver enzymes are likely related to fatty liver disease and that ultrasound will help with further evaluation and to rule out other potential causes.  Patient is amenable, new order for right upper quadrant ultrasound placed today

## 2022-09-18 DIAGNOSIS — Z08 Encounter for follow-up examination after completed treatment for malignant neoplasm: Secondary | ICD-10-CM | POA: Diagnosis not present

## 2022-09-18 DIAGNOSIS — Z85828 Personal history of other malignant neoplasm of skin: Secondary | ICD-10-CM | POA: Diagnosis not present

## 2022-09-25 ENCOUNTER — Ambulatory Visit (INDEPENDENT_AMBULATORY_CARE_PROVIDER_SITE_OTHER): Payer: BC Managed Care – PPO

## 2022-09-25 ENCOUNTER — Ambulatory Visit (HOSPITAL_BASED_OUTPATIENT_CLINIC_OR_DEPARTMENT_OTHER): Payer: BC Managed Care – PPO | Admitting: Family Medicine

## 2022-09-25 DIAGNOSIS — Z0000 Encounter for general adult medical examination without abnormal findings: Secondary | ICD-10-CM

## 2022-09-25 DIAGNOSIS — M67431 Ganglion, right wrist: Secondary | ICD-10-CM

## 2022-09-25 NOTE — Progress Notes (Signed)
    Procedures performed today:    Procedure: Real-time Ultrasound Guided aspiration of right wrist ganglion cyst  Device: Samsung HS60  Verbal informed consent obtained.  Time-out conducted.  Noted no overlying erythema, induration, or other signs of local infection.  Skin prepped in a sterile fashion. With sterile technique and under real time ultrasound guidance: Ganglion cyst visualized under ultrasound, appeared to have single septation within it, utilizing Doppler, no flow was noted within the ganglion cyst, adjacent radial artery was appreciated.  22-gauge needle on 3 mL syringe was introduced into ganglion cyst without issue, about 0.1 cc of lidocaine without epinephrine was utilized to help with aspiration.  Unfortunately, unable to aspirate due to high viscosity of material within the ganglion cyst. Completed without difficulty  Advised to call if fevers/chills, erythema, induration, drainage, or persistent bleeding.  Images permanently stored and available for review in PACS.   Independent interpretation of notes and tests performed by another provider:   None.  Brief History, Exam, Impression, and Recommendations:    Ganglion cyst of wrist, right Patient presenting for right wrist ganglion cyst aspiration, procedure note above.  Unfortunately, due to viscosity of cystic fluid, unable to successfully complete aspiration today.  Did discuss options with patient including referral to upper extremity specialist for potential excision of ganglion cyst.  Additionally, reattempt of aspiration could be performed and would be plan to have done with larger gauge needle such as 18-gauge due to viscosity of fluid within ganglion cyst.  For now, she would like to continue with monitoring which is reasonable  Return in 6 months (on 03/27/2023), or if symptoms worsen or fail to improve, for CPE with [facility] a few days prior.   ___________________________________________ Ernestene Coover de [place], MD, ABFM,  [facility] and [place]

## 2022-09-25 NOTE — Patient Instructions (Signed)
  Medication Instructions:  Your physician recommends that you continue on your current medications as directed. Please refer to the Current Medication list given to you today. --If you need a refill on any your medications before your next appointment, please call your pharmacy first. If no refills are authorized on file call the office.-- Lab Work: Your physician has recommended that you have lab work today: No If you have labs (blood work) drawn today and your tests are completely normal, you will receive your results via [place] a phone call from our staff.  Please ensure you check your voicemail in the event that you authorized detailed messages to be left on a delegated number. If you have any lab test that is abnormal or we need to change your treatment, we will call you to review the results.  Referrals/Procedures/Imaging: No  Follow-Up: Your next appointment:   Your physician recommends that you schedule a follow-up appointment in: 6 months cpe with Dr. de [place]. Nurse visit for labs a few days before cpe.  You will receive a text message or e-mail with a link to a survey about your care and experience with [place] today! We would greatly appreciate your feedback!   Thanks for letting us be apart of your health journey!!  Primary Care and Sports Medicine   Dr. Arlina Robes [place]   We encourage you to activate your patient portal called "MyChart".  Sign up information is provided on this After Visit Summary.  MyChart is used to connect with patients for Virtual Visits (Telemedicine).  Patients are able to view lab/test results, encounter notes, upcoming appointments, etc.  Non-urgent messages can be sent to your provider as well. To learn more about what you can do with MyChart, please visit --  [email or web address].

## 2022-09-27 NOTE — Assessment & Plan Note (Signed)
Patient presenting for right wrist ganglion cyst aspiration, procedure note above.  Unfortunately, due to viscosity of cystic fluid, unable to successfully complete aspiration today.  Did discuss options with patient including referral to upper extremity specialist for potential excision of ganglion cyst.  Additionally, reattempt of aspiration could be performed and would be plan to have done with larger gauge needle such as 18-gauge due to viscosity of fluid within ganglion cyst.  For now, she would like to continue with monitoring which is reasonable

## 2022-10-17 DIAGNOSIS — M4802 Spinal stenosis, cervical region: Secondary | ICD-10-CM | POA: Diagnosis not present

## 2022-10-17 DIAGNOSIS — M47816 Spondylosis without myelopathy or radiculopathy, lumbar region: Secondary | ICD-10-CM | POA: Diagnosis not present

## 2022-10-31 ENCOUNTER — Ambulatory Visit (HOSPITAL_BASED_OUTPATIENT_CLINIC_OR_DEPARTMENT_OTHER): Payer: BC Managed Care – PPO | Admitting: Family Medicine

## 2022-11-15 DIAGNOSIS — M47816 Spondylosis without myelopathy or radiculopathy, lumbar region: Secondary | ICD-10-CM | POA: Diagnosis not present

## 2022-11-15 DIAGNOSIS — Z6825 Body mass index (BMI) 25.0-25.9, adult: Secondary | ICD-10-CM | POA: Diagnosis not present

## 2022-11-15 DIAGNOSIS — M4802 Spinal stenosis, cervical region: Secondary | ICD-10-CM | POA: Diagnosis not present

## 2023-01-08 DIAGNOSIS — C44722 Squamous cell carcinoma of skin of right lower limb, including hip: Secondary | ICD-10-CM | POA: Diagnosis not present

## 2023-01-08 DIAGNOSIS — X32XXXD Exposure to sunlight, subsequent encounter: Secondary | ICD-10-CM | POA: Diagnosis not present

## 2023-01-08 DIAGNOSIS — L570 Actinic keratosis: Secondary | ICD-10-CM | POA: Diagnosis not present

## 2023-02-13 DIAGNOSIS — Z85828 Personal history of other malignant neoplasm of skin: Secondary | ICD-10-CM | POA: Diagnosis not present

## 2023-02-13 DIAGNOSIS — D485 Neoplasm of uncertain behavior of skin: Secondary | ICD-10-CM | POA: Diagnosis not present

## 2023-02-13 DIAGNOSIS — Z08 Encounter for follow-up examination after completed treatment for malignant neoplasm: Secondary | ICD-10-CM | POA: Diagnosis not present

## 2023-02-13 DIAGNOSIS — C44622 Squamous cell carcinoma of skin of right upper limb, including shoulder: Secondary | ICD-10-CM | POA: Diagnosis not present

## 2023-02-14 DIAGNOSIS — Z6826 Body mass index (BMI) 26.0-26.9, adult: Secondary | ICD-10-CM | POA: Diagnosis not present

## 2023-02-14 DIAGNOSIS — M4802 Spinal stenosis, cervical region: Secondary | ICD-10-CM | POA: Diagnosis not present

## 2023-02-14 DIAGNOSIS — M5412 Radiculopathy, cervical region: Secondary | ICD-10-CM | POA: Diagnosis not present

## 2023-03-05 DIAGNOSIS — M4802 Spinal stenosis, cervical region: Secondary | ICD-10-CM | POA: Diagnosis not present

## 2023-03-05 DIAGNOSIS — M5416 Radiculopathy, lumbar region: Secondary | ICD-10-CM | POA: Diagnosis not present

## 2023-03-05 DIAGNOSIS — M4322 Fusion of spine, cervical region: Secondary | ICD-10-CM | POA: Diagnosis not present

## 2023-03-05 DIAGNOSIS — M5126 Other intervertebral disc displacement, lumbar region: Secondary | ICD-10-CM | POA: Diagnosis not present

## 2023-03-13 DIAGNOSIS — M5416 Radiculopathy, lumbar region: Secondary | ICD-10-CM | POA: Diagnosis not present

## 2023-03-13 DIAGNOSIS — N2889 Other specified disorders of kidney and ureter: Secondary | ICD-10-CM | POA: Diagnosis not present

## 2023-03-13 DIAGNOSIS — Z6826 Body mass index (BMI) 26.0-26.9, adult: Secondary | ICD-10-CM | POA: Diagnosis not present

## 2023-03-19 DIAGNOSIS — N2889 Other specified disorders of kidney and ureter: Secondary | ICD-10-CM | POA: Diagnosis not present

## 2023-03-19 DIAGNOSIS — N281 Cyst of kidney, acquired: Secondary | ICD-10-CM | POA: Diagnosis not present

## 2023-03-21 ENCOUNTER — Ambulatory Visit (HOSPITAL_BASED_OUTPATIENT_CLINIC_OR_DEPARTMENT_OTHER): Payer: BC Managed Care – PPO

## 2023-03-21 DIAGNOSIS — Z0000 Encounter for general adult medical examination without abnormal findings: Secondary | ICD-10-CM | POA: Diagnosis not present

## 2023-03-22 LAB — COMPREHENSIVE METABOLIC PANEL
ALT: 22 IU/L (ref 0–32)
AST: 26 IU/L (ref 0–40)
Albumin/Globulin Ratio: 1.8 (ref 1.2–2.2)
Albumin: 4.4 g/dL (ref 3.8–4.9)
Alkaline Phosphatase: 62 IU/L (ref 44–121)
BUN/Creatinine Ratio: 10 (ref 9–23)
BUN: 7 mg/dL (ref 6–24)
Bilirubin Total: 0.4 mg/dL (ref 0.0–1.2)
CO2: 19 mmol/L — ABNORMAL LOW (ref 20–29)
Calcium: 9.4 mg/dL (ref 8.7–10.2)
Chloride: 106 mmol/L (ref 96–106)
Creatinine, Ser: 0.67 mg/dL (ref 0.57–1.00)
Globulin, Total: 2.4 g/dL (ref 1.5–4.5)
Glucose: 98 mg/dL (ref 70–99)
Potassium: 4.7 mmol/L (ref 3.5–5.2)
Sodium: 142 mmol/L (ref 134–144)
Total Protein: 6.8 g/dL (ref 6.0–8.5)
eGFR: 101 mL/min/{1.73_m2} (ref >59)

## 2023-03-22 LAB — CBC WITH DIFFERENTIAL/PLATELET
Basophils Absolute: 0 10*3/uL (ref 0.0–0.2)
Basos: 1 %
EOS (ABSOLUTE): 0.1 10*3/uL (ref 0.0–0.4)
Eos: 1 %
Hematocrit: 43.2 % (ref 34.0–46.6)
Hemoglobin: 14.5 g/dL (ref 11.1–15.9)
Immature Grans (Abs): 0 10*3/uL (ref 0.0–0.1)
Immature Granulocytes: 0 %
Lymphocytes Absolute: 2.4 10*3/uL (ref 0.7–3.1)
Lymphs: 29 %
MCH: 34.1 pg — ABNORMAL HIGH (ref 26.6–33.0)
MCHC: 33.6 g/dL (ref 31.5–35.7)
MCV: 102 fL — ABNORMAL HIGH (ref 79–97)
Monocytes Absolute: 0.7 10*3/uL (ref 0.1–0.9)
Monocytes: 8 %
Neutrophils Absolute: 5 10*3/uL (ref 1.4–7.0)
Neutrophils: 61 %
Platelets: 177 10*3/uL (ref 150–450)
RBC: 4.25 x10E6/uL (ref 3.77–5.28)
RDW: 12.2 % (ref 11.7–15.4)
WBC: 8.2 10*3/uL (ref 3.4–10.8)

## 2023-03-22 LAB — LIPID PANEL
Chol/HDL Ratio: 3.9 ratio (ref 0.0–4.4)
Cholesterol, Total: 198 mg/dL (ref 100–199)
HDL: 51 mg/dL (ref >39)
LDL Chol Calc (NIH): 109 mg/dL — ABNORMAL HIGH (ref 0–99)
Triglycerides: 222 mg/dL — ABNORMAL HIGH (ref 0–149)
VLDL Cholesterol Cal: 38 mg/dL (ref 5–40)

## 2023-03-22 LAB — HEMOGLOBIN A1C
Est. average glucose Bld gHb Est-mCnc: 105 mg/dL
Hgb A1c MFr Bld: 5.3 % (ref 4.8–5.6)

## 2023-03-22 LAB — TSH RFX ON ABNORMAL TO FREE T4: TSH: 3.17 u[IU]/mL (ref 0.450–4.500)

## 2023-03-26 DIAGNOSIS — Z85828 Personal history of other malignant neoplasm of skin: Secondary | ICD-10-CM | POA: Diagnosis not present

## 2023-03-26 DIAGNOSIS — Z08 Encounter for follow-up examination after completed treatment for malignant neoplasm: Secondary | ICD-10-CM | POA: Diagnosis not present

## 2023-03-27 DIAGNOSIS — M6281 Muscle weakness (generalized): Secondary | ICD-10-CM | POA: Diagnosis not present

## 2023-03-27 DIAGNOSIS — M2560 Stiffness of unspecified joint, not elsewhere classified: Secondary | ICD-10-CM | POA: Diagnosis not present

## 2023-03-27 DIAGNOSIS — M5416 Radiculopathy, lumbar region: Secondary | ICD-10-CM | POA: Diagnosis not present

## 2023-03-28 ENCOUNTER — Ambulatory Visit (INDEPENDENT_AMBULATORY_CARE_PROVIDER_SITE_OTHER): Payer: BC Managed Care – PPO | Admitting: Family Medicine

## 2023-03-28 ENCOUNTER — Encounter (HOSPITAL_BASED_OUTPATIENT_CLINIC_OR_DEPARTMENT_OTHER): Payer: Self-pay | Admitting: Family Medicine

## 2023-03-28 ENCOUNTER — Encounter (HOSPITAL_BASED_OUTPATIENT_CLINIC_OR_DEPARTMENT_OTHER): Payer: BC Managed Care – PPO | Admitting: Family Medicine

## 2023-03-28 VITALS — BP 133/76 | HR 71 | Ht 65.5 in | Wt 163.9 lb

## 2023-03-28 DIAGNOSIS — N2889 Other specified disorders of kidney and ureter: Secondary | ICD-10-CM

## 2023-03-28 DIAGNOSIS — Z0000 Encounter for general adult medical examination without abnormal findings: Secondary | ICD-10-CM

## 2023-03-28 NOTE — Progress Notes (Signed)
Complete physical exam  Patient: Andrea Bailey   DOB: 1966/07/10   57 y.o. Female  MRN: [id number]  Subjective:    Andrea Bailey is a 57 y.o. female who presents today for a complete physical exam. She reports consuming a general diet. Home exercise routine includes walking 1.5 hrs per day. She generally feels well. She reports some issues with staying asleep. Gabapentin is helping her to get to sleep faster she reports. She does not have additional problems to discuss today.   Started focusing on exercise in February- she will walk for 45 minutes in the morning and 45 minutes in the evening. She plans to continue focusing on exercise as her back pain allows her.  Depression screenings:    03/28/2023    8:48 AM 09/12/2022    4:01 PM 08/23/2022    3:47 PM  Depression screen PHQ 2/9  Decreased Interest 0 0 0  Down, Depressed, Hopeless 0 0 0  PHQ - 2 Score 0 0 0  Altered sleeping  0 1  Tired, decreased energy  0 0  Change in appetite  0 0  Feeling bad or failure about yourself   0 0  Trouble concentrating  0 0  Moving slowly or fidgety/restless  0 0  Suicidal thoughts  0 0  PHQ-9 Score  0 1  Difficult doing work/chores  Not difficult at all     Patient Care Team: de [place], Buren Kos, MD as PCP - General (Family Medicine) [place], MD (Inactive) (Gastroenterology)   Outpatient Medications Prior to Visit  Medication Sig   FYAVOLV 1-5 MG-MCG TABS tablet TAKE 1 TABLET BY MOUTH EVERY DAY   gabapentin (NEURONTIN) 300 MG capsule Take by mouth.   Magnesium 500 MG TABS Take 500 mg by mouth in the morning.   valACYclovir (VALTREX) 1000 MG tablet Take 1 tablet (1,000 mg total) by mouth daily.   No facility-administered medications prior to visit.    Review of Systems  Constitutional:  Negative for malaise/fatigue and weight loss.  HENT:  Negative for ear pain and tinnitus.   Eyes:  Negative for blurred vision and double vision.  Respiratory:  Negative for cough and shortness of  breath.   Cardiovascular:  Negative for chest pain and palpitations.  Gastrointestinal:  Negative for abdominal pain, nausea and vomiting.  Genitourinary:  Negative for flank pain, frequency, hematuria and urgency.  Musculoskeletal:  Positive for back pain (chronic) and neck pain (chronic). Negative for myalgias.  Neurological:  Negative for dizziness, weakness and headaches.  Psychiatric/Behavioral:  Negative for depression and suicidal ideas. The patient is not nervous/anxious.    Objective:     BP 133/76   Pulse 71   Ht 5' 5.5" (1.664 m)   Wt 163 lb 14.4 oz (74.3 kg)   SpO2 100%   BMI 26.86 kg/m  BP Readings from Last 3 Encounters:  03/28/23 133/76  09/12/22 135/62  08/23/22 120/69   The 10-year ASCVD risk score (Arnett DK, et al., 2019) is: 6.7%   Values used to calculate the score:     Age: 22 years     Sex: Female     Is Non-Hispanic African American: No     Diabetic: No     Tobacco smoker: Yes     Systolic Blood Pressure: 133 mmHg     Is BP treated: No     HDL Cholesterol: 51 mg/dL     Total Cholesterol: 198 mg/dL   Physical Exam  Constitutional:      Appearance: Normal appearance.  HENT:     Right Ear: Tympanic membrane, ear canal and external ear normal.     Left Ear: Tympanic membrane, ear canal and external ear normal.     Mouth/Throat:     Mouth: Mucous membranes are moist.     Pharynx: Oropharynx is clear.  Eyes:     Extraocular Movements: Extraocular movements intact.     Conjunctiva/sclera: Conjunctivae normal.     Pupils: Pupils are equal, round, and reactive to light.  Cardiovascular:     Rate and Rhythm: Normal rate and regular rhythm.     Pulses: Normal pulses.     Heart sounds: Normal heart sounds.  Pulmonary:     Effort: Pulmonary effort is normal.     Breath sounds: Normal breath sounds.  Abdominal:     General: Bowel sounds are normal.     Palpations: Abdomen is soft.  Musculoskeletal:        General: Normal range of motion.      Cervical back: Normal range of motion.  Skin:    General: Skin is warm and dry.  Neurological:     Mental Status: She is alert.  Psychiatric:        Mood and Affect: Mood normal.        Behavior: Behavior normal.        Thought Content: Thought content normal.        Judgment: Judgment normal.      Assessment & Plan:    Routine Health Maintenance and Physical Exam  Health Maintenance  Topic Date Due   COVID-19 Vaccine (1) Never done   HIV Screening  Never done   Zoster (Shingles) Vaccine (1 of 2) Never done   Flu Shot  07/04/2023   Mammogram  08/27/2024   Pap Smear  08/23/2025   DTaP/Tdap/Td vaccine (2 - Td or Tdap) 03/06/2032   Colon Cancer Screening  07/13/2032   Hepatitis C Screening: USPSTF Recommendation to screen - Ages 18-79 yo.  Completed   HPV Vaccine  Aged Out   1. Wellness examination Routine HCM labs ordered. Labs reviewed/discussed today. Discussed ASCVD risk.  Review of PMH, FH, SH, medications and HM performed.  Educated heart healthy/Mediterranean diet, with whole grains, fruits, vegetable, fish, lean meats, nuts, and olive oil. Limit salt. Recommend approximately 150 minutes/week of moderate intensity exercise. Continue participating in daily exercise!  Recommend avoidance of tobacco products. Avoid excess alcohol. Recommend regular dental and vision exams Always use seatbelt/lap and shoulder restraints. Recommend using smoke alarms and checking batteries at least twice a year. Recommend using sunscreen when outside. Discussed immunization recommendations. Vaccines are UTD. Reports she had the first 2 COVID vaccines, but no additional boosters.  Patient will consider Shingrix vaccine and let us know how she would like to proceed.   Colonoscopy:  07/13/2022 MMG:  08/27/2022, Negative Last pap smear:  08/23/2022 AS-CUS, due in 3 years.   BMD:  not due at current time  2. Left kidney mass Patient is followed by neurosurgery due to history of cervical and  thoracic chronic pain. She reports having had neck operation June 2023. She saw them this past month and they performed a chest x-ray. Noting a left mass on her kidney. Abdominal MRI was ordered and patient was referred to urology. She denies any abdominal pain, flank pain, urinary symptoms- including hematuria, nausea, vomiting, loss of appetite, weight loss, fever, lightheadedness. She has an appointment with urology on 04/15/2023.  Return in about 6 months (around 09/27/2023) for elevated lipids .   Alyson Reedy, FNP

## 2023-04-01 DIAGNOSIS — M2560 Stiffness of unspecified joint, not elsewhere classified: Secondary | ICD-10-CM | POA: Diagnosis not present

## 2023-04-01 DIAGNOSIS — M6281 Muscle weakness (generalized): Secondary | ICD-10-CM | POA: Diagnosis not present

## 2023-04-01 DIAGNOSIS — M5416 Radiculopathy, lumbar region: Secondary | ICD-10-CM | POA: Diagnosis not present

## 2023-04-04 DIAGNOSIS — M6281 Muscle weakness (generalized): Secondary | ICD-10-CM | POA: Diagnosis not present

## 2023-04-04 DIAGNOSIS — M2560 Stiffness of unspecified joint, not elsewhere classified: Secondary | ICD-10-CM | POA: Diagnosis not present

## 2023-04-04 DIAGNOSIS — M5416 Radiculopathy, lumbar region: Secondary | ICD-10-CM | POA: Diagnosis not present

## 2023-04-08 DIAGNOSIS — M5416 Radiculopathy, lumbar region: Secondary | ICD-10-CM | POA: Diagnosis not present

## 2023-04-08 DIAGNOSIS — M6281 Muscle weakness (generalized): Secondary | ICD-10-CM | POA: Diagnosis not present

## 2023-04-08 DIAGNOSIS — M2560 Stiffness of unspecified joint, not elsewhere classified: Secondary | ICD-10-CM | POA: Diagnosis not present

## 2023-04-11 DIAGNOSIS — M5416 Radiculopathy, lumbar region: Secondary | ICD-10-CM | POA: Diagnosis not present

## 2023-04-11 DIAGNOSIS — M2560 Stiffness of unspecified joint, not elsewhere classified: Secondary | ICD-10-CM | POA: Diagnosis not present

## 2023-04-11 DIAGNOSIS — M6281 Muscle weakness (generalized): Secondary | ICD-10-CM | POA: Diagnosis not present

## 2023-04-15 DIAGNOSIS — D49512 Neoplasm of unspecified behavior of left kidney: Secondary | ICD-10-CM | POA: Diagnosis not present

## 2023-04-16 DIAGNOSIS — M6281 Muscle weakness (generalized): Secondary | ICD-10-CM | POA: Diagnosis not present

## 2023-04-16 DIAGNOSIS — M2560 Stiffness of unspecified joint, not elsewhere classified: Secondary | ICD-10-CM | POA: Diagnosis not present

## 2023-04-16 DIAGNOSIS — M5416 Radiculopathy, lumbar region: Secondary | ICD-10-CM | POA: Diagnosis not present

## 2023-04-18 DIAGNOSIS — M5416 Radiculopathy, lumbar region: Secondary | ICD-10-CM | POA: Diagnosis not present

## 2023-04-18 DIAGNOSIS — M2560 Stiffness of unspecified joint, not elsewhere classified: Secondary | ICD-10-CM | POA: Diagnosis not present

## 2023-04-18 DIAGNOSIS — M6281 Muscle weakness (generalized): Secondary | ICD-10-CM | POA: Diagnosis not present

## 2023-04-22 DIAGNOSIS — M2560 Stiffness of unspecified joint, not elsewhere classified: Secondary | ICD-10-CM | POA: Diagnosis not present

## 2023-04-22 DIAGNOSIS — M6281 Muscle weakness (generalized): Secondary | ICD-10-CM | POA: Diagnosis not present

## 2023-04-22 DIAGNOSIS — M5416 Radiculopathy, lumbar region: Secondary | ICD-10-CM | POA: Diagnosis not present

## 2023-04-23 DIAGNOSIS — D49512 Neoplasm of unspecified behavior of left kidney: Secondary | ICD-10-CM | POA: Diagnosis not present

## 2023-04-24 DIAGNOSIS — M5416 Radiculopathy, lumbar region: Secondary | ICD-10-CM | POA: Diagnosis not present

## 2023-04-24 DIAGNOSIS — Z6827 Body mass index (BMI) 27.0-27.9, adult: Secondary | ICD-10-CM | POA: Diagnosis not present

## 2023-05-03 ENCOUNTER — Other Ambulatory Visit: Payer: Self-pay | Admitting: Urology

## 2023-05-20 DIAGNOSIS — D49512 Neoplasm of unspecified behavior of left kidney: Secondary | ICD-10-CM | POA: Diagnosis not present

## 2023-05-20 DIAGNOSIS — C649 Malignant neoplasm of unspecified kidney, except renal pelvis: Secondary | ICD-10-CM | POA: Diagnosis not present

## 2023-06-10 ENCOUNTER — Encounter (HOSPITAL_COMMUNITY): Admission: RE | Admit: 2023-06-10 | Payer: BC Managed Care – PPO | Source: Ambulatory Visit

## 2023-06-11 DIAGNOSIS — D49512 Neoplasm of unspecified behavior of left kidney: Secondary | ICD-10-CM | POA: Diagnosis not present

## 2023-06-17 NOTE — Progress Notes (Addendum)
Anesthesia Review:  PCP: Vale Haven, NP LOV 03/28/23  Cardiologist : none  Chest x-ray : EKG : Echo : Stress test: Cardiac Cath :  Activity level: can do a flgiht of stairs without difficutly  Sleep Study/ CPAP : none  Fasting Blood Sugar :      / Checks Blood Sugar -- times a day:   Blood Thinner/ Instructions /Last Dose: ASA / Instructions/ Last Dose :    Smoker    Consent form order does not state " left" on order.  Added to consent.  Called and LVMM for Shanda Bumps at Dr Liliane Shi office and informed them to add left to consent for.

## 2023-06-18 NOTE — Patient Instructions (Signed)
SURGICAL WAITING ROOM VISITATION  Patients having surgery or a procedure may have no more than 2 support people in the waiting area - these visitors may rotate.    Children under the age of 8 must have an adult with them who is not the patient.  Due to an increase in RSV and influenza rates and associated hospitalizations, children ages 59 and under may not visit patients in [facility] hospitals.  If the patient needs to stay at the hospital during part of their recovery, the visitor guidelines for inpatient rooms apply. Pre-op nurse will coordinate an appropriate time for 1 support person to accompany patient in pre-op.  This support person may not rotate.    Please refer to the [facility] website for the visitor guidelines for Inpatients (after your surgery is over and you are in a regular room).       Your procedure is scheduled on:  07/03/23    Report to [facility] Main Entrance    Report to admitting at  0630 AM   Call this number if you have problems the morning of surgery [phone]   Do not eat food  or drink liquids :After Midnight.   Clear liquid diet the day before surgery.    Water Non-Citrus Juices (without pulp, NO RED-Apple, White grape, White cranberry) Black Coffee (NO MILK/CREAM OR CREAMERS, sugar ok)  Clear Tea (NO MILK/CREAM OR CREAMERS, sugar ok) regular and decaf                             Plain Jell-O (NO RED)                                           Fruit ices (not with fruit pulp, NO RED)                                     Popsicles (NO RED)                                                               Sports drinks like Gatorade (NO RED)                            If you have questions, please contact your surgeon's office.   FOLLOW BOWEL PREP AND ANY ADDITIONAL PRE OP INSTRUCTIONS YOU RECEIVED FROM YOUR SURGEON'S OFFICE!!!     Oral Hygiene is also important to reduce your risk of infection.                                     Remember - BRUSH YOUR TEETH THE MORNING OF SURGERY WITH YOUR REGULAR TOOTHPASTE  DENTURES WILL BE REMOVED PRIOR TO SURGERY PLEASE DO NOT APPLY "Poly grip" OR ADHESIVES!!!   Do NOT smoke after Midnight   Take these medicines the morning of surgery with A SIP OF WATER:  gabapentin   DO NOT TAKE ANY ORAL  DIABETIC MEDICATIONS DAY OF YOUR SURGERY  Bring CPAP mask and tubing day of surgery.                              You may not have any metal on your body including hair pins, jewelry, and body piercing             Do not wear make-up, lotions, powders, perfumes/cologne, or deodorant  Do not wear nail polish including gel and S&S, artificial/acrylic nails, or any other type of covering on natural nails including finger and toenails. If you have artificial nails, gel coating, etc. that needs to be removed by a nail salon please have this removed prior to surgery or surgery may need to be canceled/ delayed if the surgeon/ anesthesia feels like they are unable to be safely monitored.   Do not shave  48 hours prior to surgery.               Men may shave face and neck.   Do not bring valuables to the hospital. [place] IS NOT             RESPONSIBLE   FOR VALUABLES.   Contacts, glasses, dentures or bridgework may not be worn into surgery.   Bring small overnight bag day of surgery.   DO NOT BRING YOUR HOME MEDICATIONS TO THE HOSPITAL. PHARMACY WILL DISPENSE MEDICATIONS LISTED ON YOUR MEDICATION LIST TO YOU DURING YOUR ADMISSION IN THE HOSPITAL!    Patients discharged on the day of surgery will not be allowed to drive home.  Someone NEEDS to stay with you for the first 24 hours after anesthesia.   Special Instructions: Bring a copy of your healthcare power of attorney and living will documents the day of surgery if you haven't scanned them before.              Please read over the following fact sheets you were given: IF YOU HAVE QUESTIONS ABOUT YOUR PRE-OP INSTRUCTIONS PLEASE CALL  [phone]   If you received a COVID test during your pre-op visit  it is requested that you wear a mask when out in public, stay away from anyone that may not be feeling well and notify your surgeon if you develop symptoms. If you test positive for Covid or have been in contact with anyone that has tested positive in the last 10 days please notify you surgeon.    [facility] - Preparing for Surgery Before surgery, you can play an important role.  Because skin is not sterile, your skin needs to be as free of germs as possible.  You can reduce the number of germs on your skin by washing with CHG (chlorahexidine gluconate) soap before surgery.  CHG is an antiseptic cleaner which kills germs and bonds with the skin to continue killing germs even after washing. Please DO NOT use if you have an allergy to CHG or antibacterial soaps.  If your skin becomes reddened/irritated stop using the CHG and inform your nurse when you arrive at Short Stay. Do not shave (including legs and underarms) for at least 48 hours prior to the first CHG shower.  You may shave your face/neck. Please follow these instructions carefully:  1.  Shower with CHG Soap the night before surgery and the  morning of Surgery.  2.  If you choose to wash your hair, wash your hair first as usual with your  normal  shampoo.  3.  After you shampoo, rinse your hair and body thoroughly to remove the  shampoo.                           4.  Use CHG as you would any other liquid soap.  You can apply chg directly  to the skin and wash                       Gently with a scrungie or clean washcloth.  5.  Apply the CHG Soap to your body ONLY FROM THE NECK DOWN.   Do not use on face/ open                           Wound or open sores. Avoid contact with eyes, ears mouth and genitals (private parts).                       Wash face,  Genitals (private parts) with your normal soap.             6.  Wash thoroughly, paying special attention to the area  where your surgery  will be performed.  7.  Thoroughly rinse your body with warm water from the neck down.  8.  DO NOT shower/wash with your normal soap after using and rinsing off  the CHG Soap.                9.  Pat yourself dry with a clean towel.            10.  Wear clean pajamas.            11.  Place clean sheets on your bed the night of your first shower and do not  sleep with pets. Day of Surgery : Do not apply any lotions/deodorants the morning of surgery.  Please wear clean clothes to the hospital/surgery center.  FAILURE TO FOLLOW THESE INSTRUCTIONS MAY RESULT IN THE CANCELLATION OF YOUR SURGERY PATIENT SIGNATURE_________________________________  NURSE SIGNATURE__________________________________  ________________________________________________________________________

## 2023-06-20 ENCOUNTER — Encounter (HOSPITAL_COMMUNITY): Payer: Self-pay

## 2023-06-20 ENCOUNTER — Other Ambulatory Visit: Payer: Self-pay

## 2023-06-20 ENCOUNTER — Encounter (HOSPITAL_COMMUNITY)
Admission: RE | Admit: 2023-06-20 | Discharge: 2023-06-20 | Disposition: A | Discharge status: Home / Self Care | Payer: BC Managed Care – PPO | Source: Ambulatory Visit | Attending: Urology | Admitting: Urology

## 2023-06-20 VITALS — BP 123/85 | HR 81 | Temp 98.6°F | Resp 16 | Ht 65.0 in | Wt 170.0 lb

## 2023-06-20 DIAGNOSIS — Z01812 Encounter for preprocedural laboratory examination: Secondary | ICD-10-CM | POA: Diagnosis not present

## 2023-06-20 DIAGNOSIS — Z01818 Encounter for other preprocedural examination: Secondary | ICD-10-CM

## 2023-06-20 HISTORY — DX: Unspecified osteoarthritis, unspecified site: M19.90

## 2023-06-20 LAB — CBC
HCT: 43.8 % (ref 36.0–46.0)
Hemoglobin: 14.5 g/dL (ref 12.0–15.0)
MCH: 34.7 pg — ABNORMAL HIGH (ref 26.0–34.0)
MCHC: 33.1 g/dL (ref 30.0–36.0)
MCV: 104.8 fL — ABNORMAL HIGH (ref 80.0–100.0)
Platelets: 179 10*3/uL (ref 150–400)
RBC: 4.18 MIL/uL (ref 3.87–5.11)
RDW: 13.2 % (ref 11.5–15.5)
WBC: 9.2 10*3/uL (ref 4.0–10.5)
nRBC: 0 % (ref 0.0–0.2)

## 2023-06-20 LAB — TYPE AND SCREEN
ABO/RH(D): A POS
Antibody Screen: NEGATIVE

## 2023-06-20 LAB — BASIC METABOLIC PANEL
Anion gap: 9 (ref 5–15)
BUN: 13 mg/dL (ref 6–20)
CO2: 23 mmol/L (ref 22–32)
Calcium: 9 mg/dL (ref 8.9–10.3)
Chloride: 107 mmol/L (ref 98–111)
Creatinine, Ser: 0.74 mg/dL (ref 0.44–1.00)
GFR, Estimated: >60 mL/min (ref >60)
Glucose, Bld: 101 mg/dL — ABNORMAL HIGH (ref 70–99)
Potassium: 4.5 mmol/L (ref 3.5–5.1)
Sodium: 139 mmol/L (ref 135–145)

## 2023-07-02 NOTE — Anesthesia Preprocedure Evaluation (Signed)
Anesthesia Evaluation    Reviewed: Allergy & Precautions, Patient's Chart, lab work & pertinent test results  Airway Mallampati: II  TM Distance: >3 FB Neck ROM: Full   Comment: Neck sx Dental  (+) Dental Advisory Given, Teeth Intact   Pulmonary Current Smoker and Patient abstained from smoking.   Pulmonary exam normal breath sounds clear to auscultation       Cardiovascular negative cardio ROS Normal cardiovascular exam Rhythm:Regular Rate:Normal     Neuro/Psych negative neurological ROS  negative psych ROS   GI/Hepatic negative GI ROS, Neg liver ROS,,,  Endo/Other  negative endocrine ROS    Renal/GU negative Renal ROS  negative genitourinary   Musculoskeletal negative musculoskeletal ROS (+) Arthritis ,    Abdominal   Peds negative pediatric ROS (+)  Hematology negative hematology ROS (+)   Anesthesia Other Findings   Reproductive/Obstetrics negative OB ROS                             Anesthesia Physical Anesthesia Plan  ASA: 2  Anesthesia Plan: General   Post-op Pain Management: Tylenol PO (pre-op)*, Celebrex PO (pre-op)* and Ketamine IV*   Induction: Intravenous  PONV Risk Score and Plan: 3 and Ondansetron, Dexamethasone and Treatment may vary due to age or medical condition  Airway Management Planned: Oral ETT  Additional Equipment: None  Intra-op Plan:   Post-operative Plan: Extubation in OR  Informed Consent: I have reviewed the patients History and Physical, chart, labs and discussed the procedure including the risks, benefits and alternatives for the proposed anesthesia with the patient or authorized representative who has indicated his/her understanding and acceptance.     Dental advisory given  Plan Discussed with: CRNA, Surgeon and Anesthesiologist  Anesthesia Plan Comments: (2 LB IV's  )        Anesthesia Quick Evaluation

## 2023-07-03 ENCOUNTER — Other Ambulatory Visit: Payer: Self-pay

## 2023-07-03 ENCOUNTER — Ambulatory Visit (HOSPITAL_COMMUNITY): Payer: BC Managed Care – PPO | Admitting: Physician Assistant

## 2023-07-03 ENCOUNTER — Observation Stay (HOSPITAL_COMMUNITY)
Admission: RE | Admit: 2023-07-03 | Discharge: 2023-07-04 | Disposition: A | Discharge status: Home / Self Care | Payer: BC Managed Care – PPO | Source: Ambulatory Visit | Attending: Urology | Admitting: Urology

## 2023-07-03 ENCOUNTER — Encounter (HOSPITAL_COMMUNITY)
Admission: RE | Disposition: A | Discharge status: Home / Self Care | Payer: Self-pay | Source: Ambulatory Visit | Attending: Urology

## 2023-07-03 ENCOUNTER — Ambulatory Visit (HOSPITAL_COMMUNITY): Payer: BC Managed Care – PPO | Admitting: Registered Nurse

## 2023-07-03 ENCOUNTER — Encounter (HOSPITAL_COMMUNITY): Payer: Self-pay | Admitting: Urology

## 2023-07-03 DIAGNOSIS — N2889 Other specified disorders of kidney and ureter: Secondary | ICD-10-CM | POA: Diagnosis not present

## 2023-07-03 DIAGNOSIS — C642 Malignant neoplasm of left kidney, except renal pelvis: Secondary | ICD-10-CM | POA: Diagnosis not present

## 2023-07-03 DIAGNOSIS — Z87891 Personal history of nicotine dependence: Secondary | ICD-10-CM | POA: Diagnosis not present

## 2023-07-03 DIAGNOSIS — D49512 Neoplasm of unspecified behavior of left kidney: Secondary | ICD-10-CM | POA: Diagnosis not present

## 2023-07-03 DIAGNOSIS — Z01818 Encounter for other preprocedural examination: Secondary | ICD-10-CM

## 2023-07-03 HISTORY — PX: ROBOTIC ASSITED PARTIAL NEPHRECTOMY: SHX6087

## 2023-07-03 LAB — HEMOGLOBIN AND HEMATOCRIT, BLOOD
HCT: 41.1 % (ref 36.0–46.0)
Hemoglobin: 13.2 g/dL (ref 12.0–15.0)

## 2023-07-03 SURGERY — NEPHRECTOMY, PARTIAL, ROBOT-ASSISTED
Anesthesia: General | Laterality: Left

## 2023-07-03 MED ORDER — PROPOFOL 10 MG/ML IV BOLUS
INTRAVENOUS | Status: AC
  Filled 2023-07-03: qty 20

## 2023-07-03 MED ORDER — LIDOCAINE HCL 2 % IJ SOLN
INTRAMUSCULAR | Status: AC
  Filled 2023-07-03: qty 20

## 2023-07-03 MED ORDER — NORETHINDRONE-ETH ESTRADIOL 1-5 MG-MCG PO TABS: 1.0000 | ORAL_TABLET | Freq: Every day | ORAL | Status: DC

## 2023-07-03 MED ORDER — KETAMINE HCL 50 MG/5ML IJ SOSY
PREFILLED_SYRINGE | INTRAMUSCULAR | Status: AC
  Filled 2023-07-03: qty 5

## 2023-07-03 MED ORDER — DOCUSATE SODIUM 100 MG PO CAPS
100.0000 mg | ORAL_CAPSULE | Freq: Two times a day (BID) | ORAL | Status: DC
  Administered 2023-07-04 (×2): 100 mg via ORAL
  Filled 2023-07-03 (×2): qty 1

## 2023-07-03 MED ORDER — MEPERIDINE HCL 50 MG/ML IJ SOLN: 6.2500 mg | INTRAMUSCULAR | Status: DC | PRN

## 2023-07-03 MED ORDER — DIPHENHYDRAMINE HCL 50 MG/ML IJ SOLN: 12.5000 mg | Freq: Four times a day (QID) | INTRAMUSCULAR | Status: DC | PRN

## 2023-07-03 MED ORDER — HYDROMORPHONE HCL 1 MG/ML IJ SOLN
0.5000 mg | INTRAMUSCULAR | Status: DC | PRN
  Administered 2023-07-03 – 2023-07-04 (×4): 1 mg via INTRAVENOUS
  Filled 2023-07-03 (×4): qty 1

## 2023-07-03 MED ORDER — ORAL CARE MOUTH RINSE: 15.0000 mL | Freq: Once | OROMUCOSAL | Status: AC

## 2023-07-03 MED ORDER — HYDROMORPHONE HCL 1 MG/ML IJ SOLN
0.2500 mg | INTRAMUSCULAR | Status: DC | PRN
  Administered 2023-07-03: 0.5 mg via INTRAVENOUS

## 2023-07-03 MED ORDER — CEFAZOLIN SODIUM-DEXTROSE 2-4 GM/100ML-% IV SOLN
2.0000 g | INTRAVENOUS | Status: AC
  Administered 2023-07-03: 2 g via INTRAVENOUS
  Filled 2023-07-03: qty 100

## 2023-07-03 MED ORDER — "VISTASEAL 4 ML SINGLE DOSE KIT "
PACK | CUTANEOUS | Status: DC | PRN
  Administered 2023-07-03: 4 mL via TOPICAL

## 2023-07-03 MED ORDER — HYOSCYAMINE SULFATE 0.125 MG SL SUBL: 0.1250 mg | SUBLINGUAL_TABLET | SUBLINGUAL | Status: DC | PRN

## 2023-07-03 MED ORDER — ACETAMINOPHEN 500 MG PO TABS
1000.0000 mg | ORAL_TABLET | Freq: Once | ORAL | Status: AC
  Administered 2023-07-03: 1000 mg via ORAL
  Filled 2023-07-03: qty 2

## 2023-07-03 MED ORDER — ROCURONIUM BROMIDE 10 MG/ML (PF) SYRINGE
PREFILLED_SYRINGE | INTRAVENOUS | Status: DC | PRN
  Administered 2023-07-03: 70 mg via INTRAVENOUS
  Administered 2023-07-03: 10 mg via INTRAVENOUS

## 2023-07-03 MED ORDER — FENTANYL CITRATE (PF) 100 MCG/2ML IJ SOLN
INTRAMUSCULAR | Status: DC | PRN
  Administered 2023-07-03 (×2): 50 ug via INTRAVENOUS

## 2023-07-03 MED ORDER — BUPIVACAINE LIPOSOME 1.3 % IJ SUSP
INTRAMUSCULAR | Status: DC | PRN
  Administered 2023-07-03: 20 mL

## 2023-07-03 MED ORDER — SODIUM CHLORIDE (PF) 0.9 % IJ SOLN
INTRAMUSCULAR | Status: AC
  Filled 2023-07-03: qty 20

## 2023-07-03 MED ORDER — DIPHENHYDRAMINE HCL 12.5 MG/5ML PO ELIX: 12.5000 mg | ORAL_SOLUTION | Freq: Four times a day (QID) | ORAL | Status: DC | PRN

## 2023-07-03 MED ORDER — FENTANYL CITRATE PF 50 MCG/ML IJ SOSY
25.0000 ug | PREFILLED_SYRINGE | INTRAMUSCULAR | Status: DC | PRN
  Administered 2023-07-03 (×2): 50 ug via INTRAVENOUS

## 2023-07-03 MED ORDER — KETAMINE HCL 10 MG/ML IJ SOLN
INTRAMUSCULAR | Status: DC | PRN
  Administered 2023-07-03 (×2): 20 mg via INTRAVENOUS

## 2023-07-03 MED ORDER — PHENYLEPHRINE HCL-NACL 20-0.9 MG/250ML-% IV SOLN
INTRAVENOUS | Status: DC | PRN
  Administered 2023-07-03: 25 ug/min via INTRAVENOUS

## 2023-07-03 MED ORDER — MIDAZOLAM HCL 2 MG/2ML IJ SOLN
INTRAMUSCULAR | Status: AC
  Filled 2023-07-03: qty 2

## 2023-07-03 MED ORDER — OXYCODONE HCL 5 MG PO TABS
5.0000 mg | ORAL_TABLET | Freq: Once | ORAL | Status: AC | PRN
  Administered 2023-07-03: 5 mg via ORAL

## 2023-07-03 MED ORDER — MIDAZOLAM HCL 5 MG/5ML IJ SOLN
INTRAMUSCULAR | Status: DC | PRN
  Administered 2023-07-03: 2 mg via INTRAVENOUS

## 2023-07-03 MED ORDER — CHLORHEXIDINE GLUCONATE 0.12 % MT SOLN
15.0000 mL | Freq: Once | OROMUCOSAL | Status: AC
  Administered 2023-07-03: 15 mL via OROMUCOSAL

## 2023-07-03 MED ORDER — PROPOFOL 10 MG/ML IV BOLUS
INTRAVENOUS | Status: DC | PRN
  Administered 2023-07-03: 180 mg via INTRAVENOUS

## 2023-07-03 MED ORDER — DEXAMETHASONE SODIUM PHOSPHATE 10 MG/ML IJ SOLN
INTRAMUSCULAR | Status: DC | PRN
  Administered 2023-07-03: 8 mg via INTRAVENOUS

## 2023-07-03 MED ORDER — HYDROCODONE-ACETAMINOPHEN 5-325 MG PO TABS: 1.0000 | ORAL_TABLET | Freq: Four times a day (QID) | ORAL | 0 refills | Status: DC | PRN

## 2023-07-03 MED ORDER — SODIUM CHLORIDE (PF) 0.9 % IJ SOLN
INTRAMUSCULAR | Status: DC | PRN
  Administered 2023-07-03: 20 mL

## 2023-07-03 MED ORDER — OXYCODONE HCL 5 MG/5ML PO SOLN: 5.0000 mg | Freq: Once | ORAL | Status: AC | PRN

## 2023-07-03 MED ORDER — FENTANYL CITRATE (PF) 100 MCG/2ML IJ SOLN
INTRAMUSCULAR | Status: AC
  Filled 2023-07-03: qty 2

## 2023-07-03 MED ORDER — ONDANSETRON HCL 4 MG/2ML IJ SOLN: 4.0000 mg | INTRAMUSCULAR | Status: DC | PRN

## 2023-07-03 MED ORDER — ONDANSETRON HCL 4 MG/2ML IJ SOLN
INTRAMUSCULAR | Status: AC
  Filled 2023-07-03: qty 2

## 2023-07-03 MED ORDER — OXYCODONE HCL 5 MG PO TABS
5.0000 mg | ORAL_TABLET | ORAL | Status: DC | PRN
  Administered 2023-07-04 (×2): 5 mg via ORAL
  Filled 2023-07-03 (×2): qty 1

## 2023-07-03 MED ORDER — ACETAMINOPHEN 160 MG/5ML PO SOLN: 325.0000 mg | ORAL | Status: DC | PRN

## 2023-07-03 MED ORDER — ACETAMINOPHEN 500 MG PO TABS
1000.0000 mg | ORAL_TABLET | Freq: Four times a day (QID) | ORAL | Status: AC
  Administered 2023-07-03 – 2023-07-04 (×4): 1000 mg via ORAL
  Filled 2023-07-03 (×4): qty 2

## 2023-07-03 MED ORDER — HYDROMORPHONE HCL 1 MG/ML IJ SOLN
INTRAMUSCULAR | Status: AC
  Administered 2023-07-03: 0.5 mg via INTRAVENOUS
  Filled 2023-07-03: qty 2

## 2023-07-03 MED ORDER — SUGAMMADEX SODIUM 200 MG/2ML IV SOLN
INTRAVENOUS | Status: DC | PRN
  Administered 2023-07-03: 200 mg via INTRAVENOUS

## 2023-07-03 MED ORDER — LACTATED RINGERS IR SOLN
Status: DC | PRN
  Administered 2023-07-03: 1000 mL

## 2023-07-03 MED ORDER — OXYCODONE HCL 5 MG PO TABS
ORAL_TABLET | ORAL | Status: AC
  Filled 2023-07-03: qty 1

## 2023-07-03 MED ORDER — FENTANYL CITRATE PF 50 MCG/ML IJ SOSY
PREFILLED_SYRINGE | INTRAMUSCULAR | Status: AC
  Administered 2023-07-03: 50 ug via INTRAVENOUS
  Filled 2023-07-03: qty 3

## 2023-07-03 MED ORDER — ACETAMINOPHEN 325 MG PO TABS: 325.0000 mg | ORAL_TABLET | ORAL | Status: DC | PRN

## 2023-07-03 MED ORDER — LIDOCAINE 2% (20 MG/ML) 5 ML SYRINGE
INTRAMUSCULAR | Status: DC | PRN
  Administered 2023-07-03: 80 mg via INTRAVENOUS
  Administered 2023-07-03: 1.5 mg/kg/h via INTRAVENOUS

## 2023-07-03 MED ORDER — SODIUM CHLORIDE 0.45 % IV SOLN: INTRAVENOUS | Status: DC

## 2023-07-03 MED ORDER — BUPIVACAINE LIPOSOME 1.3 % IJ SUSP
INTRAMUSCULAR | Status: AC
  Filled 2023-07-03: qty 20

## 2023-07-03 MED ORDER — ONDANSETRON HCL 4 MG/2ML IJ SOLN
INTRAMUSCULAR | Status: DC | PRN
  Administered 2023-07-03: 4 mg via INTRAVENOUS

## 2023-07-03 MED ORDER — LACTATED RINGERS IV SOLN: INTRAVENOUS | Status: DC

## 2023-07-03 MED ORDER — CELECOXIB 200 MG PO CAPS
200.0000 mg | ORAL_CAPSULE | Freq: Once | ORAL | Status: AC
  Administered 2023-07-03: 200 mg via ORAL
  Filled 2023-07-03: qty 1

## 2023-07-03 MED ORDER — ROCURONIUM BROMIDE 10 MG/ML (PF) SYRINGE
PREFILLED_SYRINGE | INTRAVENOUS | Status: AC
  Filled 2023-07-03: qty 10

## 2023-07-03 MED ORDER — DEXAMETHASONE SODIUM PHOSPHATE 10 MG/ML IJ SOLN
INTRAMUSCULAR | Status: AC
  Filled 2023-07-03: qty 1

## 2023-07-03 MED ORDER — "VISTASEAL 4 ML SINGLE DOSE KIT "
4.0000 mL | PACK | Freq: Once | CUTANEOUS | Status: DC
  Filled 2023-07-03: qty 4

## 2023-07-03 MED ORDER — DOCUSATE SODIUM 100 MG PO CAPS: 100.0000 mg | ORAL_CAPSULE | Freq: Two times a day (BID) | ORAL | Status: DC

## 2023-07-03 MED ORDER — ONDANSETRON HCL 4 MG/2ML IJ SOLN: 4.0000 mg | Freq: Once | INTRAMUSCULAR | Status: DC | PRN

## 2023-07-03 SURGICAL SUPPLY — 80 items
ADH SKN CLS APL DERMABOND .7 (GAUZE/BANDAGES/DRESSINGS) ×1
AGENT HMST KT MTR STRL THRMB (HEMOSTASIS)
APL ESCP 34 STRL LF DISP (HEMOSTASIS) ×1
APL LAPSCP 35 DL APL RGD (MISCELLANEOUS)
APL PRP STRL LF DISP 70% ISPRP (MISCELLANEOUS) ×1
APPLICATOR SURGIFLO ENDO (HEMOSTASIS) IMPLANT
APPLICATOR VISTASEAL 35 (MISCELLANEOUS) ×1 IMPLANT
BAG COUNTER SPONGE SURGICOUNT (BAG) IMPLANT
BAG SPNG CNTER NS LX DISP (BAG)
CAUTERY HOOK MNPLR 1.6 DVNC XI (INSTRUMENTS) ×1 IMPLANT
CHLORAPREP W/TINT 26 (MISCELLANEOUS) ×1 IMPLANT
CLIP LIGATING HEM O LOK PURPLE (MISCELLANEOUS) ×2 IMPLANT
CLIP LIGATING HEMO LOK XL GOLD (MISCELLANEOUS) IMPLANT
CLIP LIGATING HEMO O LOK GREEN (MISCELLANEOUS) IMPLANT
CLIP SUT LAPRA TY ABSORB (SUTURE) ×2 IMPLANT
COVER SURGICAL LIGHT HANDLE (MISCELLANEOUS) ×1 IMPLANT
COVER TIP SHEARS 8 DVNC (MISCELLANEOUS) ×1 IMPLANT
CUTTER ECHEON FLEX ENDO 45 340 (ENDOMECHANICALS) IMPLANT
DERMABOND ADVANCED .7 DNX12 (GAUZE/BANDAGES/DRESSINGS) ×1 IMPLANT
DRAIN CHANNEL 15F RND FF 3/16 (WOUND CARE) IMPLANT
DRAPE ARM DVNC X/XI (DISPOSABLE) ×4 IMPLANT
DRAPE COLUMN DVNC XI (DISPOSABLE) ×1 IMPLANT
DRAPE INCISE IOBAN 66X45 STRL (DRAPES) ×1 IMPLANT
DRAPE SHEET LG 3/4 BI-LAMINATE (DRAPES) ×1 IMPLANT
DRIVER NDL LRG 8 DVNC XI (INSTRUMENTS) ×3 IMPLANT
DRIVER NDLE LRG 8 DVNC XI (INSTRUMENTS) ×2 IMPLANT
ELECT PENCIL ROCKER SW 15FT (MISCELLANEOUS) ×1 IMPLANT
ELECT REM PT RETURN 15FT ADLT (MISCELLANEOUS) ×1 IMPLANT
EVACUATOR SILICONE 100CC (DRAIN) IMPLANT
FORCEPS PROGRASP DVNC XI (FORCEP) ×2 IMPLANT
GAUZE 4X4 16PLY ~~LOC~~+RFID DBL (SPONGE) IMPLANT
GLOVE BIO SURGEON STRL SZ 6.5 (GLOVE) ×1 IMPLANT
GLOVE BIOGEL PI IND STRL 8 (GLOVE) ×1 IMPLANT
GLOVE SURG LX STRL 7.5 STRW (GLOVE) ×2 IMPLANT
GOWN STRL REUS W/ TWL XL LVL3 (GOWN DISPOSABLE) ×2 IMPLANT
GOWN STRL REUS W/TWL XL LVL3 (GOWN DISPOSABLE) ×2
GOWN STRL SURGICAL XL XLNG (GOWN DISPOSABLE) ×1 IMPLANT
HEMOSTAT SURGICEL 4X8 (HEMOSTASIS) ×1 IMPLANT
HOLDER FOLEY CATH W/STRAP (MISCELLANEOUS) ×1 IMPLANT
IRRIG SUCT STRYKERFLOW 2 WTIP (MISCELLANEOUS) ×1
IRRIGATION SUCT STRKRFLW 2 WTP (MISCELLANEOUS) ×1 IMPLANT
KIT BASIN OR (CUSTOM PROCEDURE TRAY) ×1 IMPLANT
KIT TURNOVER KIT A (KITS) IMPLANT
LOOP VESSEL MAXI BLUE (MISCELLANEOUS) IMPLANT
MARKER SKIN DUAL TIP RULER LAB (MISCELLANEOUS) ×1 IMPLANT
NDL INSUFFLATION 14GA 120MM (NEEDLE) ×1 IMPLANT
NEEDLE INSUFFLATION 14GA 120MM (NEEDLE) ×1 IMPLANT
PROTECTOR NERVE ULNAR (MISCELLANEOUS) ×2 IMPLANT
RELOAD STAPLE 45 2.6 WHT THIN (STAPLE) IMPLANT
SCISSORS LAP 5X45 EPIX DISP (ENDOMECHANICALS) ×1 IMPLANT
SCISSORS MNPLR CVD DVNC XI (INSTRUMENTS) ×1 IMPLANT
SEAL UNIV 5-12 XI (MISCELLANEOUS) ×3 IMPLANT
SET TUBE SMOKE EVAC HIGH FLOW (TUBING) ×1 IMPLANT
SOL ELECTROSURG ANTI STICK (MISCELLANEOUS) ×1
SOLUTION ELECTROSURG ANTI STCK (MISCELLANEOUS) ×1 IMPLANT
SPIKE FLUID TRANSFER (MISCELLANEOUS) ×1 IMPLANT
STAPLE RELOAD 45 WHT (STAPLE) IMPLANT
STAPLE RELOAD 45MM WHITE (STAPLE)
SURGIFLO W/THROMBIN 8M KIT (HEMOSTASIS) IMPLANT
SUT ETHILON 2 0 PS N (SUTURE) IMPLANT
SUT ETHILON 2 0 PSLX (SUTURE) IMPLANT
SUT MNCRL AB 4-0 PS2 18 (SUTURE) ×2 IMPLANT
SUT PDS AB 0 CT1 36 (SUTURE) IMPLANT
SUT V-LOC BARB 180 2/0GR6 GS22 (SUTURE) ×1
SUT VIC AB 1 CT1 36 (SUTURE) ×4 IMPLANT
SUT VIC AB 4-0 SH 27 (SUTURE) ×2
SUT VIC AB 4-0 SH 27XBRD (SUTURE) IMPLANT
SUT VICRYL 0 UR6 27IN ABS (SUTURE) IMPLANT
SUT VLOC 3-0 9IN GRN (SUTURE) IMPLANT
SUT VLOC BARB 180 ABS3/0GR12 (SUTURE) ×1
SUTURE V-LC BRB 180 2/0GR6GS22 (SUTURE) ×1 IMPLANT
SUTURE VLOC BRB 180 ABS3/0GR12 (SUTURE) ×1 IMPLANT
SYS BAG RETRIEVAL 10MM (BASKET) ×1
SYSTEM BAG RETRIEVAL 10MM (BASKET) ×1 IMPLANT
TOWEL OR 17X26 10 PK STRL BLUE (TOWEL DISPOSABLE) ×1 IMPLANT
TRAY FOLEY MTR SLVR 16FR STAT (SET/KITS/TRAYS/PACK) ×1 IMPLANT
TRAY LAPAROSCOPIC (CUSTOM PROCEDURE TRAY) ×1 IMPLANT
TROCAR Z THREAD OPTICAL 12X100 (TROCAR) ×1 IMPLANT
TROCAR Z-THREAD OPTICAL 5X100M (TROCAR) IMPLANT
WATER STERILE IRR 1000ML POUR (IV SOLUTION) ×1 IMPLANT

## 2023-07-03 NOTE — H&P (Signed)
Office Visit Report     06/11/2023   --------------------------------------------------------------------------------   Andrea Bailey  MRN: [id number]  DOB: 02-08-1966, 57 year old Female  SSN:    PRIMARY CARE:  Raymond De Peru, MD  PRIMARY CARE FAX:  [phone]  REFERRING:  Si Raider. Liliane Shi, MD  PROVIDER:  Marcine Matar, M.D.  TREATING:  Ulyses Amor, [place]  LOCATION:  [facility] [phone]     --------------------------------------------------------------------------------   CC/HPI: Pt presents today for pre-operative history and physical exam in anticipation of left robotic assisted lap partial nephrectomy by Dr. Liliane Shi on 07/03/23. She is doing well and is without complaint.   CT chest without evidence of metastatic disease.   Pt denies F/C, HA, CP, SOB, N/V, diarrhea/constipation, back pain, flank pain, hematuria, and dysuria.     HX:   Renal mass   Ms. Andrea Bailey is a 57 year old female with a solid and enhancing renal mass measuring 2.4 cm involving the LEFT kidney. The mass was incidentally identified via MRI during an evaluation for low back pain.   -Anatomy: Mesophytic left, posterior mid-pole mass abutting the left renal pelvis and vasculature. Right kidney is WNL  -Personal/family history of GU malignancies: denies  -Smoking history: 1 ppd for the past 30+ years  -Prior abdominal surgeries: lap cholecystectomy in 1995  -Renal function: BMP from 03/2023-- serum creatinine of 0.67 with eGFR of 99  -History of kidney stones: denies  -She denies flank pain or hematuria     ALLERGIES: None   MEDICATIONS: Fyavolv 1 mg-5 mcg tablet 1 tablet PO Daily  Gabapentin  Magnesium 500 mg capsule 1 capsule PO Daily  Valacyclovir 1,000 mg tablet 1 tablet PO Daily     GU PSH: None   NON-GU PSH: Back Surgery (Unspecified) - 2013 C-Section - 1998 Neck Surgery - 2023 Remove Gallbladder - about 1995     GU PMH: Left renal neoplasm - 05/20/2023,  - 05/20/2023, - 04/23/2023, 24 mm incidental left renal mass in a 57 year old female. No evident extra renal process going on., - 04/15/2023    NON-GU PMH: Arthritis    FAMILY HISTORY: 2 daughters - Daughter 1 son - Son   SOCIAL HISTORY: Marital Status: Divorced Ethnicity: Not Hispanic Or Latino; Race: White Current Smoking Status: Patient smokes. Has smoked since 04/03/2003. Smokes 1 pack per day.   Tobacco Use Assessment Completed: Used Tobacco in last 30 days? Does not use smokeless tobacco. Social Drinker.  Does not use drugs. Does not drink caffeine. Has not had a blood transfusion.     Notes: ETOH socially once per week    REVIEW OF SYSTEMS:    GU Review Female:   Patient denies frequent urination, hard to postpone urination, burning /pain with urination, get up at night to urinate, leakage of urine, stream starts and stops, trouble starting your stream, have to strain to urinate, and being pregnant.  Gastrointestinal (Upper):   Patient denies nausea, vomiting, and indigestion/ heartburn.  Gastrointestinal (Lower):   Patient denies diarrhea and constipation.  Constitutional:   Patient denies fever, night sweats, weight loss, and fatigue.  Skin:   Patient denies itching and skin rash/ lesion.  Eyes:   Patient denies blurred vision and double vision.  Ears/ Nose/ Throat:   Patient denies sore throat and sinus problems.  Hematologic/Lymphatic:   Patient denies swollen glands and easy bruising.  Cardiovascular:   Patient denies leg swelling and chest pains.  Respiratory:   Patient denies cough and shortness  of breath.  Endocrine:   Patient denies excessive thirst.  Musculoskeletal:   Patient denies back pain and joint pain.  Neurological:   Patient denies headaches and dizziness.  Psychologic:   Patient denies depression and anxiety.   VITAL SIGNS:      06/11/2023 01:08 PM  Weight 170 lb / 77.11 kg  Height 65 in / 165.1 cm  BP 124/70 mmHg  Pulse 83 /min  Temperature 98.1 F  / 36.7 C  BMI 28.3 kg/m   MULTI-SYSTEM PHYSICAL EXAMINATION:    Constitutional: Well-nourished. No physical deformities. Normally developed. Good grooming.  Neck: Neck symmetrical, not swollen. Normal tracheal position.  Respiratory: Normal breath sounds. No labored breathing, no use of accessory muscles.   Cardiovascular: Regular rate and rhythm. No murmur, no gallop.   Lymphatic: No enlargement of neck, axillae, groin.  Skin: No paleness, no jaundice, no cyanosis. No lesion, no ulcer, no rash.  Neurologic / Psychiatric: Oriented to time, oriented to place, oriented to person. No depression, no anxiety, no agitation.  Gastrointestinal: No mass, no tenderness, no rigidity, non obese abdomen.  Eyes: Normal conjunctivae. Normal eyelids.  Ears, Nose, Mouth, and Throat: Left ear no scars, no lesions, no masses. Right ear no scars, no lesions, no masses. Nose no scars, no lesions, no masses. Normal hearing. Normal lips.  Musculoskeletal: Normal gait and station of head and neck.     Complexity of Data:  Records Review:   Previous Patient Records  Urine Test Review:   Urinalysis   06/11/23  Urinalysis  Urine Appearance Clear   Urine Color Yellow   Urine Glucose Neg mg/dL  Urine Bilirubin Neg mg/dL  Urine Ketones Neg mg/dL  Urine Specific Gravity 1.020   Urine Blood Neg ery/uL  Urine pH 7.5   Urine Protein Neg mg/dL  Urine Urobilinogen 0.2 mg/dL  Urine Nitrites Neg   Urine Leukocyte Esterase Neg leu/uL   PROCEDURES:          Urinalysis - 81003 Dipstick Dipstick Cont'd  Color: Yellow Bilirubin: Neg mg/dL  Appearance: Clear Ketones: Neg mg/dL  Specific Gravity: 1.308 Blood: Neg ery/uL  pH: 7.5 Protein: Neg mg/dL  Glucose: Neg mg/dL Urobilinogen: 0.2 mg/dL    Nitrites: Neg    Leukocyte Esterase: Neg leu/uL    ASSESSMENT:      ICD-10 Details  1 GU:   Left renal neoplasm - D49.512    PLAN:           Schedule Return Visit/Planned Activity: Keep Scheduled Appointment -  Schedule Surgery          Document Letter(s):  Created for Patient: Clinical Summary         Notes:   There are no changes in the patients history or physical exam since last evaluation by Dr. Liliane Shi. Pt is scheduled to undergo left RAL partial nephrectomy on 07/03/23.   All pt's questions were answered to the best of my ability.    -I personally reviewed imaging results and films with the patient. We discussed that the mass in question has features concerning for malignancy. I explained the natural history of presumed renal cell carcinoma. I reviewed the AUA guidelines for evaluation and treatment of the small renal mass. The options of active surveillance, in situ tumor ablation, partial and radical nephrectomy was discussed. The risks of robot-assisted LEFT partial nephrectomy were discussed in detail including but not limited to: negative pathology, open conversion, completion nephrectomy, infection of the urinary tract/skin/abdominal cavity, VTE, MI/CVA, lymphatic leak, injury  to adjacent solid/hollow viscus organs, bleeding requiring a blood transfusion, catastrophic bleeding, hernia formation, need for postoperative angioembolization, urinary leak requiring stent/drain, and other imponderables.   -The mass is in close proximity to the hilar structures and I discussed the higher probability of radical nephrectomy.

## 2023-07-03 NOTE — Transfer of Care (Signed)
Immediate Anesthesia Transfer of Care Note  Patient: Andrea Bailey  Procedure(s) Performed: XI ROBOTIC ASSITED LEFT PARTIAL NEPHRECTOMY (Left)  Patient Location: PACU  Anesthesia Type:General  Level of Consciousness: awake, alert , oriented, and patient cooperative  Airway & Oxygen Therapy: Patient Spontanous Breathing and Patient connected to face mask oxygen  Post-op Assessment: Report given to RN, Post -op Vital signs reviewed and stable, and Patient moving all extremities  Post vital signs: Reviewed and stable  Last Vitals:  Vitals Value Taken Time  BP 159/94 07/03/23 1031  Temp    Pulse 74 07/03/23 1034  Resp 14 07/03/23 1034  SpO2 100 % 07/03/23 1034  Vitals shown include unfiled device data.  Last Pain:  Vitals:   07/03/23 0551  TempSrc: Oral  PainSc:       Patients Stated Pain Goal: 3 (07/03/23 0548)  Complications: No notable events documented.

## 2023-07-03 NOTE — Anesthesia Procedure Notes (Signed)
Procedure Name: Intubation Date/Time: 07/03/2023 7:49 AM  Performed by: Elisabeth Cara, CRNAPre-anesthesia Checklist: Patient identified, Emergency Drugs available, Suction available, Patient being monitored and Timeout performed Patient Re-evaluated:Patient Re-evaluated prior to induction Oxygen Delivery Method: Circle system utilized Preoxygenation: Pre-oxygenation with 100% oxygen Induction Type: IV induction Ventilation: Mask ventilation without difficulty Laryngoscope Size: Mac and 4 Grade View: Grade I Tube type: Oral Tube size: 7.5 mm Number of attempts: 2 Airway Equipment and Method: Stylet Placement Confirmation: ETT inserted through vocal cords under direct vision, positive ETCO2 and breath sounds checked- equal and bilateral Secured at: 22 cm Tube secured with: Tape Dental Injury: Teeth and Oropharynx as per pre-operative assessment

## 2023-07-03 NOTE — Anesthesia Postprocedure Evaluation (Signed)
Anesthesia Post Note  Patient: [name]  Procedure(s) Performed: XI ROBOTIC ASSITED LEFT PARTIAL NEPHRECTOMY (Left)     Patient location during evaluation: PACU Anesthesia Type: General Level of consciousness: awake and alert Pain management: pain level controlled Vital Signs Assessment: post-procedure vital signs reviewed and stable Respiratory status: spontaneous breathing, nonlabored ventilation, respiratory function stable and patient connected to nasal cannula oxygen Cardiovascular status: blood pressure returned to baseline and stable Postop Assessment: no apparent nausea or vomiting Anesthetic complications: no   No notable events documented.  Last Vitals:  Vitals:   07/03/23 1300 07/03/23 1315  BP: 131/82 119/77  Pulse: 65 (!) 59  Resp: 16 14  Temp:    SpO2: 95% 96%    Last Pain:  Vitals:   07/03/23 1300  TempSrc:   PainSc: 0-No pain                 [name]

## 2023-07-03 NOTE — TOC Progression Note (Signed)
Transition of Care [facility]) - Progression Note    Patient Details  Name: Andrea Bailey MRN: [id number] Date of Birth: Apr 28, 1966  [facility] CM/SW Contact  Geni Bers, RN Phone Number: 07/03/2023, 3:48 PM  Clinical Narrative:      Transition of Care (TOC) Screening Note   Patient Details  Name: Andrea Bailey Date of Birth: [birth year]   [facility] of Care [facility] CM/SW Contact:    Geni Bers, RN Phone Number: 07/03/2023, 3:48 PM    Transition of Care Department [facility]) has reviewed patient and no TOC needs have been identified at this time. We will continue to monitor patient advancement through interdisciplinary progression rounds. If new patient transition needs arise, please place a TOC consult.         Expected Discharge Plan and Services                                               Social Determinants of Health (SDOH) Interventions SDOH Screenings   Food Insecurity: No Food Insecurity (07/03/2023)  Housing: Low Risk  (07/03/2023)  Transportation Needs: No Transportation Needs (07/03/2023)  Utilities: Not At Risk (07/03/2023)  Alcohol Screen: Low Risk  (08/23/2022)  Depression (PHQ2-9): Low Risk  (03/28/2023)  Financial Resource Strain: Low Risk  (08/23/2022)  Physical Activity: Inactive (08/23/2022)  Social Connections: Socially Isolated (08/23/2022)  Stress: No Stress Concern Present (08/23/2022)  Tobacco Use: High Risk (07/03/2023)    Readmission Risk Interventions     No data to display

## 2023-07-03 NOTE — Plan of Care (Signed)
  Problem: Education: Goal: Knowledge of the prescribed therapeutic regimen will improve Outcome: Progressing   Problem: Clinical Measurements: Goal: Postoperative complications will be avoided or minimized Outcome: Progressing   Problem: Urinary Elimination: Goal: Ability to avoid or minimize complications of infection will improve Outcome: Progressing   Problem: Pain Managment: Goal: General experience of comfort will improve Outcome: Progressing

## 2023-07-03 NOTE — Discharge Instructions (Signed)
Activity:  You are encouraged to ambulate frequently (about every hour during waking hours) to help prevent blood clots from forming in your legs or lungs.  However, you should not engage in any heavy lifting (> 10-15 lbs), strenuous activity, or straining. Diet: You should advance your diet as instructed by your physician.  It will be normal to have some bloating, nausea, and abdominal discomfort intermittently. Prescriptions:  You will be provided a prescription for pain medication to take as needed.  If your pain is not severe enough to require the prescription pain medication, you may take extra strength Tylenol instead which will have less side effects.  You should also take a prescribed stool softener to avoid straining with bowel movements as the prescription pain medication may constipate you. Incisions: You may remove your dressing bandages 48 hours after surgery if not removed in the hospital.  You will either have some small staples or special tissue glue at each of the incision sites. Once the bandages are removed (if present), the incisions may stay open to air.  You may start showering (but not soaking or bathing in water) the 2nd day after surgery and the incisions simply need to be patted dry after the shower.  No additional care is needed. What to call us about: You should call the office ([phone]) if you develop fever > 101 or develop persistent vomiting.   You may resume aspirin, advil, aleve, vitamins, and supplements 7 days after surgery. 

## 2023-07-03 NOTE — Plan of Care (Signed)
  Problem: Education: Goal: Knowledge of the prescribed therapeutic regimen will improve Outcome: Progressing   Problem: Clinical Measurements: Goal: Postoperative complications will be avoided or minimized Outcome: Progressing   Problem: Education: Goal: Knowledge of General Education information will improve Description: Including pain rating scale, medication(s)/side effects and non-pharmacologic comfort measures Outcome: Progressing   Problem: Nutrition: Goal: Adequate nutrition will be maintained Outcome: Progressing   Problem: Pain Managment: Goal: General experience of comfort will improve Outcome: Progressing

## 2023-07-03 NOTE — Op Note (Signed)
Operative Note  Preoperative diagnosis:  1.  2.4 cm left renal mass  Postoperative diagnosis: 1.  2.4 cm left renal mass  Procedure(s): 1.  Robot-assisted laparoscopic left partial nephrectomy 2.  Intraoperative ultrasound of single retroperitoneal organ  Surgeon: Rhoderick Moody, MD  Assistants: Harrie Foreman, PA-C  An assistant was required for this surgical procedure.  The duties of the assistant included but were not limited to suctioning, passing suture, camera manipulation, retraction.  This procedure would not be able to be performed without an Geophysicist/field seismologist.    Anesthesia:  General  Complications:  None  EBL: 100 mL  Specimens: 1.  Left renal mass  Drains/Catheters: 1.  Left lower quadrant JP drain 2.  Foley catheter  Intraoperative findings:   The left renal mass was directly abutting the left renal hilar structures Grossly negative margins following excision of left renal mass Renorrhaphy was hemostatic at the conclusion of the case  Indication:  Andrea Bailey is a 57 y.o. female with a solid and enhancing 2.4 cm left renal mass with features concerning for renal cell carcinoma.  She has been consented for the above procedures, voices understanding and wishes to proceed.  Description of procedure:  After informed consent was obtained, the patient was brought to the operating room and general endotracheal anesthesia was administered.  A 16 French Foley catheter was then sterilely placed and set to gravity drainage.  The patient was then placed in the right lateral decubitus position and prepped and draped in usual sterile fashion.  A timeout was performed.  An 8 mm incision was then made lateral to the left rectus muscle at the level of the left 12th rib.  Abdominal access was obtained via a Veress needle.  The abdominal cavity was then insufflated up to 15 mmHg.  An 8 mm port was then introduced into the abdominal cavity.  Inspection of the port entry site by the robotic  camera revealed no adjacent organ injury.  We then placed 3 additional 8 mm robotic ports to triangulate the left renal hilum.  A 12 mm assistant port was then placed between the carmera port and 3rd robotic arm.  The white line of Toldt along the descending colon was incised sharply and the colon, along with its mesocolonic fat, was reflected medially until the aorta was identified.  We then made a small window adjacent to the lower pole of the left kidney, identifying the left psoas muscle, left ureter and left gonadal vein.  The left ureter and gonadal vein were then reflected anteriorly allowing [place] to then incised the perihilar attachments using electrocautery.  We encountered a small lumbar vein adjacent to the insertion of the left gonadal vein into the left renal vein.  This lumbar vein was ligated with hemo-lock clips in 2 places and incised sharply.  This provided [place] excellent exposure to the left renal hilum.  The perilymphatic tissue surrounding the left renal artery was carefully dissected away, creating a window to place a bulldog clamp later in the procedure.  The anterior portion of Gerota's fascia was incised, allowing reflection the perinephric fat medially and laterally until there was adequate exposure of the posterior midpole left renal mass.  The mass was directly abutting the left renal pelvis and hilar vasculature.  Intraoperative ultrasound confirmed the heterogenous echogenicity of the lesion compared to the remainder of the renal parenchyma and allowed identification of the depth/borders of the mass, which were demarcated using electrocautery along the renal capsule.  We then  exposed the left renal artery and placed a bulldog clamp, marking warm ischemia time.  The left kidney immediately became ischemic and pale in appearance.  The left renal mass was then sharply excised with minimal blood loss.  After the mass was free, it was placed in the left upper quadrant to be retrieved later  on during the operation.    The renorrhaphy was then performed using a 3-0 V-lock in the deep layer of the renal parenchyma.  The bulldog clamp was then removed marking warm ischemia time at 18 minutes.   A series of 1-0 Vicryl sutures with Hem-o-lok clips acting as a buttress were then used to reapproximate the renal capsule.  There did not appear to be any obvious bleeding around the renal hilum nor surrounding our repair.  The incised Gerota's fascia overlying the mass was then reapproximated using a running 2-0 V lock suture.  The mass was then placed in an Endo Catch bag.  Surgicel and Vistaseal were then applied to the resection bed.  A JP drain was inserted into the left lower quadrant incision and secured in place with a nylon suture.  The robot was then de-docked and the camera was then reinserted into the assistant port. Laparoscopic graspers were then used to grab the string of the Endo Catch bag, which was brought out through the 12 mm assistant port.  The abdomen was then desufflated and all ports were removed.  The assistant port incision was then extended approximately 1-2 cm and the left renal mass, within the Endo Catch bag, was removed and sent to pathology for permanent section.  The fascia within the assistant port incision was then reapproximated using a 0 Vicryl suture.  The remainder of the incisions were then closed using 4-0 Monocryl and dressed appropriately.  Patient tolerated the procedure well and was transferred to the postanesthesia unit in stable condition.  Plan: Monitor on the floor overnight

## 2023-07-04 ENCOUNTER — Encounter (HOSPITAL_COMMUNITY): Payer: Self-pay | Admitting: Urology

## 2023-07-04 DIAGNOSIS — N2889 Other specified disorders of kidney and ureter: Secondary | ICD-10-CM | POA: Diagnosis not present

## 2023-07-04 DIAGNOSIS — Z87891 Personal history of nicotine dependence: Secondary | ICD-10-CM | POA: Diagnosis not present

## 2023-07-04 MED ORDER — CHLORHEXIDINE GLUCONATE CLOTH 2 % EX PADS: 6.0000 | MEDICATED_PAD | Freq: Every day | CUTANEOUS | Status: DC

## 2023-07-04 NOTE — Discharge Summary (Signed)
  Date of admission: 07/03/2023  Date of discharge: 07/04/2023  Admission diagnosis: Left renal mass  Discharge diagnosis: same  Secondary diagnoses: Arthritis  History and Physical: For full details, please see admission history and physical. Briefly, Andrea Bailey is a 57 y.o. year old patient with a solid and enhancing renal mass measuring 2.4 cm involving the LEFT kidney. The mass was incidentally identified via MRI during an evaluation for low back pain.   Hospital Course: Pt was admitted and taken to the OR on 07/03/23 for a robotic assisted lap left partial nephrectomy. Pt tolerated the procedure well and was hemodynamically stable throughout.  She was extubated without complication and woke up from anesthesia neurologically intact. She was then transferred from the OR to PACU and the to the floor without issue.  Post op course progressed as expected. On POD 1 foley was removed and she was able void.  JP output was appropriate and the drain was d/c'd. Pt was passing flatus and diet was advanced as tolerated. Pt was able to ambulate and pain was well controlled.  She met all d/c criteria and was felt stable for d/c home.  Laboratory values:  Recent Labs    07/03/23 1040 07/04/23 0455  HGB 13.2 12.3  HCT 41.1 37.4   Recent Labs    07/04/23 0455  CREATININE 0.65    Disposition: Home  Discharge instruction: The patient was instructed to be ambulatory but told to refrain from heavy lifting, strenuous activity, or driving.   Discharge medications:  Allergies as of 07/04/2023       Reactions   Cymbalta [duloxetine Hcl] Other (See Comments)   Nightmare/constipation.   Neurontin [gabapentin] Other (See Comments)   "I just didn't like it" Pt taking gabapentin        Medication List     TAKE these medications    docusate sodium 100 MG capsule Commonly known as: COLACE Take 1 capsule (100 mg total) by mouth 2 (two) times daily.   Fyavolv 1-5 MG-MCG Tabs tablet Generic drug:  norethindrone-ethinyl estradiol TAKE 1 TABLET BY MOUTH EVERY DAY   gabapentin 300 MG capsule Commonly known as: NEURONTIN Take 300 mg by mouth 2 (two) times daily.   HYDROcodone-acetaminophen 5-325 MG tablet Commonly known as: Norco Take 1-2 tablets by mouth every 6 (six) hours as needed for moderate pain or severe pain.   Magnesium 500 MG Tabs Take 500 mg by mouth in the morning.   valACYclovir 1000 MG tablet Commonly known as: VALTREX Take 1 tablet (1,000 mg total) by mouth daily.        Followup:   Follow-up Information     Rene Paci, MD Follow up on 07/18/2023.   Specialty: Urology Why: at 8:15 Contact information: [address] [place] [address] [place] [address] [phone]

## 2023-07-18 DIAGNOSIS — C642 Malignant neoplasm of left kidney, except renal pelvis: Secondary | ICD-10-CM | POA: Diagnosis not present

## 2023-07-18 LAB — CBC AND DIFFERENTIAL
HCT: 42 (ref 36–46)
Hemoglobin: 14 (ref 12.0–16.0)

## 2023-07-23 ENCOUNTER — Encounter (HOSPITAL_BASED_OUTPATIENT_CLINIC_OR_DEPARTMENT_OTHER): Payer: Self-pay | Admitting: Family Medicine

## 2023-08-21 DIAGNOSIS — M5136 Other intervertebral disc degeneration, lumbar region: Secondary | ICD-10-CM | POA: Diagnosis not present

## 2023-08-21 DIAGNOSIS — Z6828 Body mass index (BMI) 28.0-28.9, adult: Secondary | ICD-10-CM | POA: Diagnosis not present

## 2023-08-21 DIAGNOSIS — Z981 Arthrodesis status: Secondary | ICD-10-CM | POA: Diagnosis not present

## 2023-08-21 DIAGNOSIS — M5416 Radiculopathy, lumbar region: Secondary | ICD-10-CM | POA: Diagnosis not present

## 2023-09-03 ENCOUNTER — Other Ambulatory Visit: Payer: Self-pay | Admitting: Adult Health

## 2023-09-09 ENCOUNTER — Other Ambulatory Visit: Payer: Self-pay | Admitting: Neurosurgery

## 2023-09-13 DIAGNOSIS — M5106 Intervertebral disc disorders with myelopathy, lumbar region: Secondary | ICD-10-CM | POA: Diagnosis not present

## 2023-09-16 ENCOUNTER — Encounter (HOSPITAL_COMMUNITY): Payer: Self-pay | Admitting: Neurosurgery

## 2023-09-16 ENCOUNTER — Other Ambulatory Visit: Payer: Self-pay

## 2023-09-16 HISTORY — PX: OTHER SURGICAL HISTORY: SHX169

## 2023-09-16 NOTE — Anesthesia Preprocedure Evaluation (Signed)
Anesthesia Evaluation  Patient identified by MRN, date of birth, ID band Patient awake    Reviewed: Allergy & Precautions, NPO status , Patient's Chart, lab work & pertinent test results  History of Anesthesia Complications Negative for: history of anesthetic complications  Airway Mallampati: II  TM Distance: >3 FB Neck ROM: Full    Dental no notable dental hx.    Pulmonary Current Smoker   Pulmonary exam normal        Cardiovascular negative cardio ROS Normal cardiovascular exam     Neuro/Psych Lumbar stenosis    GI/Hepatic negative GI ROS, Neg liver ROS,,,  Endo/Other  negative endocrine ROS    Renal/GU negative Renal ROS     Musculoskeletal  (+) Arthritis ,    Abdominal   Peds  Hematology negative hematology ROS (+)   Anesthesia Other Findings Day of surgery medications reviewed with patient.  Reproductive/Obstetrics                              Anesthesia Physical Anesthesia Plan  ASA: 2  Anesthesia Plan: General   Post-op Pain Management: Tylenol PO (pre-op)* and Ketamine IV*   Induction: Intravenous  PONV Risk Score and Plan: 3 and Treatment may vary due to age or medical condition, Ondansetron, Dexamethasone and Midazolam  Airway Management Planned: Oral ETT  Additional Equipment: None  Intra-op Plan:   Post-operative Plan: Extubation in OR  Informed Consent: I have reviewed the patients History and Physical, chart, labs and discussed the procedure including the risks, benefits and alternatives for the proposed anesthesia with the patient or authorized representative who has indicated his/her understanding and acceptance.     Dental advisory given  Plan Discussed with: CRNA  Anesthesia Plan Comments:         Anesthesia Quick Evaluation

## 2023-09-16 NOTE — Progress Notes (Signed)
PCP - Dr Ceasar Mons [place] Cardiologist - none  Chest x-ray - n/a EKG - n/a Stress Test - n/a ECHO - n/a Cardiac Cath - n/a  ICD Pacemaker/Loop - n/a  Sleep Study -  n/a  Diabetes - n/a  NPO  STOP now taking any Aspirin (unless otherwise instructed by your surgeon), Aleve, Naproxen, Ibuprofen, Motrin, Advil, Goody's, BC's, all herbal medications, fish oil, and all vitamins.   Coronavirus Screening Do you have any of the following symptoms:  Cough yes/no: No Fever (>100.25F)  yes/no: No Runny nose yes/no: No Sore throat yes/no: No Difficulty breathing/shortness of breath  yes/no: No  Have you traveled in the last 14 days and where? yes/no: No  Patient verbalized understanding of instructions that were given via phone.

## 2023-09-17 ENCOUNTER — Ambulatory Visit (HOSPITAL_COMMUNITY): Payer: Self-pay | Admitting: Anesthesiology

## 2023-09-17 ENCOUNTER — Observation Stay (HOSPITAL_COMMUNITY)
Admission: RE | Admit: 2023-09-17 | Discharge: 2023-09-17 | Disposition: A | Discharge status: Home / Self Care | Payer: BC Managed Care – PPO | Attending: Neurosurgery | Admitting: Neurosurgery

## 2023-09-17 ENCOUNTER — Ambulatory Visit (HOSPITAL_COMMUNITY): Payer: BC Managed Care – PPO

## 2023-09-17 ENCOUNTER — Ambulatory Visit (HOSPITAL_COMMUNITY): Payer: BC Managed Care – PPO | Admitting: Anesthesiology

## 2023-09-17 ENCOUNTER — Encounter (HOSPITAL_COMMUNITY)
Admission: RE | Disposition: A | Discharge status: Home / Self Care | Payer: Self-pay | Source: Home / Self Care | Attending: Neurosurgery

## 2023-09-17 DIAGNOSIS — F17210 Nicotine dependence, cigarettes, uncomplicated: Secondary | ICD-10-CM | POA: Diagnosis not present

## 2023-09-17 DIAGNOSIS — M5116 Intervertebral disc disorders with radiculopathy, lumbar region: Secondary | ICD-10-CM | POA: Diagnosis not present

## 2023-09-17 DIAGNOSIS — M51369 Other intervertebral disc degeneration, lumbar region without mention of lumbar back pain or lower extremity pain: Principal | ICD-10-CM

## 2023-09-17 DIAGNOSIS — M48061 Spinal stenosis, lumbar region without neurogenic claudication: Principal | ICD-10-CM | POA: Insufficient documentation

## 2023-09-17 DIAGNOSIS — M5416 Radiculopathy, lumbar region: Secondary | ICD-10-CM | POA: Diagnosis not present

## 2023-09-17 DIAGNOSIS — Z981 Arthrodesis status: Secondary | ICD-10-CM | POA: Diagnosis not present

## 2023-09-17 LAB — CBC
HCT: 41.9 % (ref 36.0–46.0)
Hemoglobin: 13.5 g/dL (ref 12.0–15.0)
MCH: 33 pg (ref 26.0–34.0)
MCHC: 32.2 g/dL (ref 30.0–36.0)
MCV: 102.4 fL — ABNORMAL HIGH (ref 80.0–100.0)
Platelets: 181 10*3/uL (ref 150–400)
RBC: 4.09 MIL/uL (ref 3.87–5.11)
RDW: 13 % (ref 11.5–15.5)
WBC: 8.9 10*3/uL (ref 4.0–10.5)
nRBC: 0 % (ref 0.0–0.2)

## 2023-09-17 LAB — TYPE AND SCREEN
ABO/RH(D): A POS
Antibody Screen: NEGATIVE

## 2023-09-17 LAB — BASIC METABOLIC PANEL
Anion gap: 10 (ref 5–15)
BUN: 8 mg/dL (ref 6–20)
CO2: 18 mmol/L — ABNORMAL LOW (ref 22–32)
Calcium: 8.6 mg/dL — ABNORMAL LOW (ref 8.9–10.3)
Chloride: 109 mmol/L (ref 98–111)
Creatinine, Ser: 0.65 mg/dL (ref 0.44–1.00)
GFR, Estimated: >60 mL/min (ref >60)
Glucose, Bld: 99 mg/dL (ref 70–99)
Potassium: 4.7 mmol/L (ref 3.5–5.1)
Sodium: 137 mmol/L (ref 135–145)

## 2023-09-17 LAB — SURGICAL PCR SCREEN
MRSA, PCR: NEGATIVE
Staphylococcus aureus: NEGATIVE

## 2023-09-17 SURGERY — POSTERIOR LUMBAR FUSION 1 LEVEL
Anesthesia: General | Site: Spine Lumbar

## 2023-09-17 MED ORDER — IPRATROPIUM-ALBUTEROL 0.5-2.5 (3) MG/3ML IN SOLN
RESPIRATORY_TRACT | Status: AC
  Filled 2023-09-17: qty 3

## 2023-09-17 MED ORDER — VANCOMYCIN HCL 1000 MG IV SOLR
INTRAVENOUS | Status: AC
  Filled 2023-09-17: qty 20

## 2023-09-17 MED ORDER — ACETAMINOPHEN 500 MG PO TABS
1000.0000 mg | ORAL_TABLET | Freq: Once | ORAL | Status: AC
  Administered 2023-09-17: 1000 mg via ORAL
  Filled 2023-09-17: qty 2

## 2023-09-17 MED ORDER — MAGNESIUM OXIDE -MG SUPPLEMENT 400 (240 MG) MG PO TABS: 400.0000 mg | ORAL_TABLET | Freq: Every morning | ORAL | Status: DC

## 2023-09-17 MED ORDER — FENTANYL CITRATE (PF) 250 MCG/5ML IJ SOLN
INTRAMUSCULAR | Status: DC | PRN
  Administered 2023-09-17 (×2): 50 ug via INTRAVENOUS
  Administered 2023-09-17: 100 ug via INTRAVENOUS
  Administered 2023-09-17: 20 ug via INTRAVENOUS

## 2023-09-17 MED ORDER — ALBUMIN HUMAN 5 % IV SOLN
12.5000 g | Freq: Once | INTRAVENOUS | Status: AC
  Administered 2023-09-17: 12.5 g via INTRAVENOUS

## 2023-09-17 MED ORDER — SODIUM CHLORIDE 0.9% FLUSH: 3.0000 mL | INTRAVENOUS | Status: DC | PRN

## 2023-09-17 MED ORDER — CHLORHEXIDINE GLUCONATE 0.12 % MT SOLN
OROMUCOSAL | Status: AC
  Administered 2023-09-17: 15 mL via OROMUCOSAL
  Filled 2023-09-17: qty 15

## 2023-09-17 MED ORDER — CEFAZOLIN SODIUM-DEXTROSE 1-4 GM/50ML-% IV SOLN
1.0000 g | Freq: Three times a day (TID) | INTRAVENOUS | Status: DC
  Administered 2023-09-17: 1 g via INTRAVENOUS
  Filled 2023-09-17: qty 50

## 2023-09-17 MED ORDER — 0.9 % SODIUM CHLORIDE (POUR BTL) OPTIME
TOPICAL | Status: DC | PRN
  Administered 2023-09-17: 1000 mL

## 2023-09-17 MED ORDER — SUGAMMADEX SODIUM 200 MG/2ML IV SOLN
INTRAVENOUS | Status: DC | PRN
  Administered 2023-09-17: 200 mg via INTRAVENOUS

## 2023-09-17 MED ORDER — HYDROMORPHONE HCL 1 MG/ML IJ SOLN: 1.0000 mg | INTRAMUSCULAR | Status: DC | PRN

## 2023-09-17 MED ORDER — FENTANYL CITRATE (PF) 250 MCG/5ML IJ SOLN
INTRAMUSCULAR | Status: AC
  Filled 2023-09-17: qty 5

## 2023-09-17 MED ORDER — THROMBIN 20000 UNITS EX SOLR: CUTANEOUS | Status: DC | PRN

## 2023-09-17 MED ORDER — THROMBIN 20000 UNITS EX SOLR
CUTANEOUS | Status: AC
  Filled 2023-09-17: qty 20000

## 2023-09-17 MED ORDER — FLEET ENEMA RE ENEM: 1.0000 | ENEMA | Freq: Once | RECTAL | Status: DC | PRN

## 2023-09-17 MED ORDER — SODIUM CHLORIDE 0.9 % IV SOLN: 250.0000 mL | INTRAVENOUS | Status: DC

## 2023-09-17 MED ORDER — POLYETHYLENE GLYCOL 3350 17 G PO PACK: 17.0000 g | PACK | Freq: Every day | ORAL | Status: DC | PRN

## 2023-09-17 MED ORDER — LIDOCAINE 2% (20 MG/ML) 5 ML SYRINGE
INTRAMUSCULAR | Status: AC
  Filled 2023-09-17: qty 5

## 2023-09-17 MED ORDER — GABAPENTIN 300 MG PO CAPS
300.0000 mg | ORAL_CAPSULE | Freq: Two times a day (BID) | ORAL | Status: DC
  Administered 2023-09-17: 300 mg via ORAL
  Filled 2023-09-17: qty 1

## 2023-09-17 MED ORDER — DROPERIDOL 2.5 MG/ML IJ SOLN
INTRAMUSCULAR | Status: AC
  Filled 2023-09-17: qty 2

## 2023-09-17 MED ORDER — OXYCODONE HCL 10 MG PO TABS: 10.0000 mg | ORAL_TABLET | ORAL | 0 refills | Status: DC | PRN

## 2023-09-17 MED ORDER — HYDROCODONE-ACETAMINOPHEN 10-325 MG PO TABS: 1.0000 | ORAL_TABLET | ORAL | Status: DC | PRN

## 2023-09-17 MED ORDER — PHENYLEPHRINE HCL-NACL 20-0.9 MG/250ML-% IV SOLN
INTRAVENOUS | Status: DC | PRN
  Administered 2023-09-17: 10 ug/min via INTRAVENOUS

## 2023-09-17 MED ORDER — CHLORHEXIDINE GLUCONATE CLOTH 2 % EX PADS: 6.0000 | MEDICATED_PAD | Freq: Once | CUTANEOUS | Status: DC

## 2023-09-17 MED ORDER — GLYCOPYRROLATE PF 0.2 MG/ML IJ SOSY
PREFILLED_SYRINGE | INTRAMUSCULAR | Status: AC
  Filled 2023-09-17: qty 1

## 2023-09-17 MED ORDER — HYDROMORPHONE HCL 1 MG/ML IJ SOLN
INTRAMUSCULAR | Status: AC
  Filled 2023-09-17: qty 0.5

## 2023-09-17 MED ORDER — KETAMINE HCL 50 MG/5ML IJ SOSY
PREFILLED_SYRINGE | INTRAMUSCULAR | Status: AC
  Filled 2023-09-17: qty 5

## 2023-09-17 MED ORDER — ROCURONIUM BROMIDE 10 MG/ML (PF) SYRINGE
PREFILLED_SYRINGE | INTRAVENOUS | Status: DC | PRN
  Administered 2023-09-17: 10 mg via INTRAVENOUS
  Administered 2023-09-17: 60 mg via INTRAVENOUS

## 2023-09-17 MED ORDER — PHENOL 1.4 % MT LIQD: 1.0000 | OROMUCOSAL | Status: DC | PRN

## 2023-09-17 MED ORDER — DEXAMETHASONE SODIUM PHOSPHATE 10 MG/ML IJ SOLN
INTRAMUSCULAR | Status: AC
  Filled 2023-09-17: qty 1

## 2023-09-17 MED ORDER — ACETAMINOPHEN 650 MG RE SUPP: 650.0000 mg | RECTAL | Status: DC | PRN

## 2023-09-17 MED ORDER — PHENYLEPHRINE HCL-NACL 20-0.9 MG/250ML-% IV SOLN
INTRAVENOUS | Status: AC
  Filled 2023-09-17: qty 250

## 2023-09-17 MED ORDER — ROCURONIUM BROMIDE 10 MG/ML (PF) SYRINGE
PREFILLED_SYRINGE | INTRAVENOUS | Status: AC
  Filled 2023-09-17: qty 10

## 2023-09-17 MED ORDER — ORAL CARE MOUTH RINSE: 15.0000 mL | Freq: Once | OROMUCOSAL | Status: AC

## 2023-09-17 MED ORDER — BUPIVACAINE HCL (PF) 0.25 % IJ SOLN
INTRAMUSCULAR | Status: DC | PRN
  Administered 2023-09-17: 30 mL

## 2023-09-17 MED ORDER — ONDANSETRON HCL 4 MG/2ML IJ SOLN
INTRAMUSCULAR | Status: DC | PRN
  Administered 2023-09-17 (×2): 4 mg via INTRAVENOUS

## 2023-09-17 MED ORDER — DEXAMETHASONE SODIUM PHOSPHATE 10 MG/ML IJ SOLN
INTRAMUSCULAR | Status: DC | PRN
  Administered 2023-09-17: 10 mg via INTRAVENOUS

## 2023-09-17 MED ORDER — MIDAZOLAM HCL 2 MG/2ML IJ SOLN
INTRAMUSCULAR | Status: DC | PRN
  Administered 2023-09-17: 2 mg via INTRAVENOUS

## 2023-09-17 MED ORDER — SODIUM CHLORIDE 0.9% FLUSH: 3.0000 mL | Freq: Two times a day (BID) | INTRAVENOUS | Status: DC

## 2023-09-17 MED ORDER — DROPERIDOL 2.5 MG/ML IJ SOLN
0.6250 mg | Freq: Once | INTRAMUSCULAR | Status: AC | PRN
  Administered 2023-09-17: 0.625 mg via INTRAVENOUS

## 2023-09-17 MED ORDER — OXYCODONE HCL 5 MG PO TABS
10.0000 mg | ORAL_TABLET | ORAL | Status: DC | PRN
  Administered 2023-09-17 (×2): 10 mg via ORAL
  Filled 2023-09-17 (×2): qty 2

## 2023-09-17 MED ORDER — CHLORHEXIDINE GLUCONATE 0.12 % MT SOLN: 15.0000 mL | Freq: Once | OROMUCOSAL | Status: AC

## 2023-09-17 MED ORDER — HYDROMORPHONE HCL 1 MG/ML IJ SOLN: 0.2500 mg | INTRAMUSCULAR | Status: DC | PRN

## 2023-09-17 MED ORDER — ONDANSETRON HCL 4 MG/2ML IJ SOLN
INTRAMUSCULAR | Status: AC
  Filled 2023-09-17: qty 4

## 2023-09-17 MED ORDER — PROPOFOL 10 MG/ML IV BOLUS
INTRAVENOUS | Status: AC
  Filled 2023-09-17: qty 20

## 2023-09-17 MED ORDER — ACETAMINOPHEN 325 MG PO TABS
650.0000 mg | ORAL_TABLET | ORAL | Status: DC | PRN
  Administered 2023-09-17: 650 mg via ORAL
  Filled 2023-09-17: qty 2

## 2023-09-17 MED ORDER — LACTATED RINGERS IV SOLN
INTRAVENOUS | Status: DC | PRN
Start: 2023-09-17 — End: 2023-09-17

## 2023-09-17 MED ORDER — BUPIVACAINE HCL (PF) 0.25 % IJ SOLN
INTRAMUSCULAR | Status: AC
  Filled 2023-09-17: qty 30

## 2023-09-17 MED ORDER — GLYCOPYRROLATE 0.2 MG/ML IJ SOLN
INTRAMUSCULAR | Status: DC | PRN
Start: 2023-09-17 — End: 2023-09-17
  Administered 2023-09-17: .1 mg via INTRAVENOUS

## 2023-09-17 MED ORDER — IPRATROPIUM-ALBUTEROL 0.5-2.5 (3) MG/3ML IN SOLN
3.0000 mL | Freq: Once | RESPIRATORY_TRACT | Status: AC
  Administered 2023-09-17: 3 mL via RESPIRATORY_TRACT

## 2023-09-17 MED ORDER — BISACODYL 10 MG RE SUPP: 10.0000 mg | Freq: Every day | RECTAL | Status: DC | PRN

## 2023-09-17 MED ORDER — PROPOFOL 10 MG/ML IV BOLUS
INTRAVENOUS | Status: DC | PRN
  Administered 2023-09-17: 150 mg via INTRAVENOUS

## 2023-09-17 MED ORDER — PHENYLEPHRINE 80 MCG/ML (10ML) SYRINGE FOR IV PUSH (FOR BLOOD PRESSURE SUPPORT)
PREFILLED_SYRINGE | INTRAVENOUS | Status: AC
  Filled 2023-09-17: qty 10

## 2023-09-17 MED ORDER — CEFAZOLIN SODIUM-DEXTROSE 2-4 GM/100ML-% IV SOLN
2.0000 g | INTRAVENOUS | Status: AC
  Administered 2023-09-17: 2 g via INTRAVENOUS
  Filled 2023-09-17: qty 100

## 2023-09-17 MED ORDER — METHOCARBAMOL 500 MG PO TABS: 500.0000 mg | ORAL_TABLET | Freq: Four times a day (QID) | ORAL | 1 refills | Status: DC | PRN

## 2023-09-17 MED ORDER — METHOCARBAMOL 500 MG PO TABS: 500.0000 mg | ORAL_TABLET | Freq: Four times a day (QID) | ORAL | Status: DC | PRN

## 2023-09-17 MED ORDER — VALACYCLOVIR HCL 500 MG PO TABS: 1000.0000 mg | ORAL_TABLET | Freq: Every day | ORAL | Status: DC

## 2023-09-17 MED ORDER — HYDROMORPHONE HCL 1 MG/ML IJ SOLN
INTRAMUSCULAR | Status: DC | PRN
Start: 2023-09-17 — End: 2023-09-17
  Administered 2023-09-17: .5 mg via INTRAVENOUS

## 2023-09-17 MED ORDER — MENTHOL 3 MG MT LOZG: 1.0000 | LOZENGE | OROMUCOSAL | Status: DC | PRN

## 2023-09-17 MED ORDER — METHOCARBAMOL 1000 MG/10ML IJ SOLN: 500.0000 mg | Freq: Four times a day (QID) | INTRAVENOUS | Status: DC | PRN

## 2023-09-17 MED ORDER — ALBUMIN HUMAN 5 % IV SOLN
INTRAVENOUS | Status: AC
  Filled 2023-09-17: qty 250

## 2023-09-17 MED ORDER — NORETHINDRONE-ETH ESTRADIOL 1-5 MG-MCG PO TABS: 1.0000 | ORAL_TABLET | Freq: Every day | ORAL | Status: DC

## 2023-09-17 MED ORDER — PROPOFOL 1000 MG/100ML IV EMUL
INTRAVENOUS | Status: AC
  Filled 2023-09-17: qty 100

## 2023-09-17 MED ORDER — VANCOMYCIN HCL 1000 MG IV SOLR
INTRAVENOUS | Status: DC | PRN
  Administered 2023-09-17: 1000 mg

## 2023-09-17 MED ORDER — MIDAZOLAM HCL 2 MG/2ML IJ SOLN
INTRAMUSCULAR | Status: AC
  Filled 2023-09-17: qty 2

## 2023-09-17 MED ORDER — ONDANSETRON HCL 4 MG PO TABS: 4.0000 mg | ORAL_TABLET | Freq: Four times a day (QID) | ORAL | Status: DC | PRN

## 2023-09-17 MED ORDER — LIDOCAINE 2% (20 MG/ML) 5 ML SYRINGE
INTRAMUSCULAR | Status: DC | PRN
  Administered 2023-09-17: 100 mg via INTRAVENOUS

## 2023-09-17 MED ORDER — ONDANSETRON HCL 4 MG/2ML IJ SOLN: 4.0000 mg | Freq: Four times a day (QID) | INTRAMUSCULAR | Status: DC | PRN

## 2023-09-17 MED ORDER — KETAMINE HCL 10 MG/ML IJ SOLN
INTRAMUSCULAR | Status: DC | PRN
Start: 2023-09-17 — End: 2023-09-17
  Administered 2023-09-17: 30 mg via INTRAVENOUS
  Administered 2023-09-17: 20 mg via INTRAVENOUS

## 2023-09-17 SURGICAL SUPPLY — 61 items
ADH SKN CLS APL DERMABOND .7 (GAUZE/BANDAGES/DRESSINGS) ×1
APL SKNCLS STERI-STRIP NONHPOA (GAUZE/BANDAGES/DRESSINGS) ×1
BAG COUNTER SPONGE SURGICOUNT (BAG) ×1 IMPLANT
BAG DECANTER FOR FLEXI CONT (MISCELLANEOUS) ×1 IMPLANT
BAG SPNG CNTER NS LX DISP (BAG) ×1
BENZOIN TINCTURE PRP APPL 2/3 (GAUZE/BANDAGES/DRESSINGS) ×1 IMPLANT
BLADE BONE MILL MEDIUM (MISCELLANEOUS) ×1 IMPLANT
BLADE CLIPPER SURG (BLADE) IMPLANT
BUR CUTTER 7.0 ROUND (BURR) ×1 IMPLANT
BUR MATCHSTICK NEURO 3.0 LAGG (BURR) ×1 IMPLANT
CAGE EXP CATALYFT 9 (Plate) IMPLANT
CANISTER SUCT 3000ML PPV (MISCELLANEOUS) ×1 IMPLANT
CNTNR URN SCR LID CUP LEK RST (MISCELLANEOUS) ×1 IMPLANT
CONT SPEC 4OZ STRL OR WHT (MISCELLANEOUS) ×1
COVER BACK TABLE 60X90IN (DRAPES) ×1 IMPLANT
DERMABOND ADVANCED .7 DNX12 (GAUZE/BANDAGES/DRESSINGS) ×1 IMPLANT
DRAPE C-ARM 42X72 X-RAY (DRAPES) ×2 IMPLANT
DRAPE HALF SHEET 40X57 (DRAPES) IMPLANT
DRAPE LAPAROTOMY 100X72X124 (DRAPES) ×1 IMPLANT
DRAPE SURG 17X23 STRL (DRAPES) ×4 IMPLANT
DRSG OPSITE POSTOP 4X6 (GAUZE/BANDAGES/DRESSINGS) ×1 IMPLANT
DURAPREP 26ML APPLICATOR (WOUND CARE) ×1 IMPLANT
ELECT REM PT RETURN 9FT ADLT (ELECTROSURGICAL) ×1
ELECTRODE REM PT RTRN 9FT ADLT (ELECTROSURGICAL) ×1 IMPLANT
EVACUATOR 1/8 PVC DRAIN (DRAIN) IMPLANT
GAUZE 4X4 16PLY ~~LOC~~+RFID DBL (SPONGE) IMPLANT
GAUZE SPONGE 4X4 12PLY STRL (GAUZE/BANDAGES/DRESSINGS) IMPLANT
GLOVE BIO SURGEON STRL SZ 6.5 (GLOVE) ×1 IMPLANT
GLOVE BIOGEL PI IND STRL 6.5 (GLOVE) ×1 IMPLANT
GLOVE ECLIPSE 9.0 STRL (GLOVE) ×2 IMPLANT
GLOVE EXAM NITRILE XL STR (GLOVE) IMPLANT
GOWN STRL REUS W/ TWL LRG LVL3 (GOWN DISPOSABLE) IMPLANT
GOWN STRL REUS W/ TWL XL LVL3 (GOWN DISPOSABLE) ×2 IMPLANT
GOWN STRL REUS W/TWL 2XL LVL3 (GOWN DISPOSABLE) IMPLANT
GOWN STRL REUS W/TWL LRG LVL3 (GOWN DISPOSABLE)
GOWN STRL REUS W/TWL XL LVL3 (GOWN DISPOSABLE) ×2
KIT BASIN OR (CUSTOM PROCEDURE TRAY) ×1 IMPLANT
KIT TURNOVER KIT B (KITS) ×1 IMPLANT
NDL HYPO 22X1.5 SAFETY MO (MISCELLANEOUS) ×1 IMPLANT
NEEDLE HYPO 22X1.5 SAFETY MO (MISCELLANEOUS) ×1 IMPLANT
NS IRRIG 1000ML POUR BTL (IV SOLUTION) ×1 IMPLANT
PACK LAMINECTOMY NEURO (CUSTOM PROCEDURE TRAY) ×1 IMPLANT
PUTTY GRAFTON DBF 6CC W/DELIVE (Putty) IMPLANT
RASP 3.0MM (RASP) IMPLANT
ROD RADIUS 40MM (Neuro Prosthesis/Implant) ×2 IMPLANT
ROD SPNL 40X5.5XNS TI RDS (Neuro Prosthesis/Implant) IMPLANT
SCREW POLYAXIAL 6.5X45MM (Screw) IMPLANT
SCREW POLYAXIAL 7.5X45 (Screw) IMPLANT
SET SCREW (Screw) ×4 IMPLANT
SET SCREW VRST (Screw) IMPLANT
SPIKE FLUID TRANSFER (MISCELLANEOUS) ×1 IMPLANT
SPONGE SURGIFOAM ABS GEL 100 (HEMOSTASIS) ×1 IMPLANT
STRIP CLOSURE SKIN 1/2X4 (GAUZE/BANDAGES/DRESSINGS) ×2 IMPLANT
SUT VIC AB 0 CT1 18XCR BRD8 (SUTURE) ×2 IMPLANT
SUT VIC AB 0 CT1 8-18 (SUTURE) ×2
SUT VIC AB 2-0 CT1 18 (SUTURE) ×1 IMPLANT
SUT VIC AB 3-0 SH 8-18 (SUTURE) ×2 IMPLANT
TOWEL GREEN STERILE (TOWEL DISPOSABLE) ×1 IMPLANT
TOWEL GREEN STERILE FF (TOWEL DISPOSABLE) ×1 IMPLANT
TRAY FOLEY MTR SLVR 16FR STAT (SET/KITS/TRAYS/PACK) ×1 IMPLANT
WATER STERILE IRR 1000ML POUR (IV SOLUTION) ×1 IMPLANT

## 2023-09-17 NOTE — Discharge Summary (Signed)
Physician Discharge Summary  Patient ID: Andrea Bailey MRN: [id number] DOB/AGE: 01/14/1966 57 y.o.  Admit date: 09/17/2023 Discharge date: 09/17/2023  Admission Diagnoses:  Discharge Diagnoses:  Principal Problem:   Lumbar spinal stenosis due to adjacent segment disease after fusion procedure   Discharged Condition: good  Hospital Course: Patient mated to the hospital where she underwent uncomplicated L4-5 decompression and fusion.  Postoperative doing well.  Back pain well-controlled.  No lower extremity symptoms.  Standing ambulating and voiding.  Patient desires discharge home.  Consults:   Significant Diagnostic Studies:   Treatments:   Discharge Exam: Blood pressure 123/78, pulse 78, temperature 97.7 F (36.5 C), temperature source Oral, resp. rate 16, height 5\' 5"  (1.651 m), weight 79.4 kg, SpO2 98%. Awake and alert.  Oriented and appropriate.  Motor and sensory function intact.  Wound clean and dry.  Chest and abdomen benign.  Disposition: Discharge disposition: 01-Home or Self Care        Allergies as of 09/17/2023       Reactions   Cymbalta [duloxetine Hcl] Other (See Comments)   Nightmare/constipation.        Medication List     TAKE these medications    Fyavolv 1-5 MG-MCG Tabs tablet Generic drug: norethindrone-ethinyl estradiol TAKE 1 TABLET BY MOUTH EVERY DAY   gabapentin 300 MG capsule Commonly known as: NEURONTIN Take 300 mg by mouth 2 (two) times daily.   Magnesium 500 MG Tabs Take 500 mg by mouth in the morning.   methocarbamol 500 MG tablet Commonly known as: ROBAXIN Take 1 tablet (500 mg total) by mouth every 6 (six) hours as needed for muscle spasms.   Oxycodone HCl 10 MG Tabs Take 1 tablet (10 mg total) by mouth every 3 (three) hours as needed for severe pain (pain score 7-10) ((score 7 to 10)).   valACYclovir 1000 MG tablet Commonly known as: VALTREX TAKE 1 TABLET BY MOUTH EVERY DAY               Durable Medical  Equipment  (From admission, onward)           Start     Ordered   09/17/23 1237  DME Walker rolling  Once       Question:  Patient needs a walker to treat with the following condition  Answer:  Lumbar spinal stenosis due to adjacent segment disease after fusion procedure   09/17/23 1236   09/17/23 1237  DME 3 n 1  Once        09/17/23 1236             Signed: Sherilyn Cooter A Aidan Moten 09/17/2023, 6:11 PM

## 2023-09-17 NOTE — Brief Op Note (Signed)
09/17/2023  11:03 AM  PATIENT:  [name]  57 y.o. female  PRE-OPERATIVE DIAGNOSIS:  Stenosis  POST-OPERATIVE DIAGNOSIS:  Stenosis  PROCEDURE:  Procedure(s): Posterior Lumbar Interbody Fusion - Lumbar Four-Lumbar Five - Posterior Lateral and Interbody Fusion (N/A)  SURGEON:  Surgeons and Role:    Julio Sicks, MD - Primary  PHYSICIAN ASSISTANT:   ASSISTANTSMarland Mcalpine   ANESTHESIA:   general  EBL:  150cc   BLOOD ADMINISTERED:none  DRAINS: none   LOCAL MEDICATIONS USED:  MARCAINE     SPECIMEN:  No Specimen  DISPOSITION OF SPECIMEN:  N/A  COUNTS:  YES  TOURNIQUET:  * No tourniquets in log *  DICTATION: .Dragon Dictation  PLAN OF CARE: Admit for overnight observation  PATIENT DISPOSITION:  PACU - hemodynamically stable.   Delay start of Pharmacological VTE agent (>24hrs) due to surgical blood loss or risk of bleeding: yes

## 2023-09-17 NOTE — Discharge Instructions (Signed)
Wound Care Keep incision covered and dry for two days.  If you shower, cover incision with plastic wrap.  Do not put any creams, lotions, or ointments on incision. Leave steri-strips on back.  They will fall off by themselves. Activity Walk each and every day, increasing distance each day. No lifting greater than 5 lbs.  Avoid excessive neck motion. No driving for 2 weeks; may ride as a passenger locally. If provided with back brace, wear when out of bed.  It is not necessary to wear brace in bed. Diet Resume your normal diet.  Return to Work Will be discussed at you follow up appointment. Call Your Doctor If Any of These Occur Redness, drainage, or swelling at the wound.  Temperature greater than 101 degrees. Severe pain not relieved by pain medication. Incision starts to come apart. Follow Up Appt Call today for appointment in 1-2 weeks ([phone]) or for problems.  If you have any hardware placed in your spine, you will need an x-ray before your appointment.

## 2023-09-17 NOTE — Anesthesia Procedure Notes (Addendum)
Procedure Name: Intubation Date/Time: 09/17/2023 8:28 AM  Performed by: Camillia Herter, CRNAPre-anesthesia Checklist: Patient identified, Emergency Drugs available, Suction available and Patient being monitored Patient Re-evaluated:Patient Re-evaluated prior to induction Oxygen Delivery Method: Circle System Utilized Preoxygenation: Pre-oxygenation with 100% oxygen Induction Type: IV induction Ventilation: Mask ventilation without difficulty Laryngoscope Size: Miller and 2 Grade View: Grade I Tube type: Oral Tube size: 7.0 mm Number of attempts: 1 Airway Equipment and Method: Stylet and Oral airway Placement Confirmation: ETT inserted through vocal cords under direct vision, positive ETCO2 and breath sounds checked- equal and bilateral Secured at: 21 cm Tube secured with: Tape Dental Injury: Teeth and Oropharynx as per pre-operative assessment

## 2023-09-17 NOTE — Transfer of Care (Addendum)
Immediate Anesthesia Transfer of Care Note  Patient: Andrea Bailey  Procedure(s) Performed: Posterior Lumbar Interbody Fusion - Lumbar Four-Lumbar Five - Posterior Lateral and Interbody Fusion (Spine Lumbar)  Patient Location: PACU  Anesthesia Type:General  Level of Consciousness: awake  Airway & Oxygen Therapy: Patient Spontanous Breathing and Patient connected to face mask oxygen  Post-op Assessment: Report given to RN and Pt noted to be nauseated, SBP in 70s. Fluids wide open, 02 via NRB mask. Pt given Droperidol for nausea by PACU RN (pt already received 8mg  Zofran intra-op).  Pt also given Neosynephrine IV. Dr Stephannie Peters aware and at bedside. Repeat SBP in 80s, pt to receive additional IVF.  Post vital signs: Reviewed and stable  Last Vitals:  Vitals Value Taken Time  BP 74/58 09/17/23 1145  Temp 36.4 C 09/17/23 1120  Pulse 92 09/17/23 1148  Resp 16 09/17/23 1148  SpO2 86 % 09/17/23 1148  Vitals shown include unfiled device data.  Last Pain:  Vitals:   09/17/23 1120  TempSrc:   PainSc: 7       Patients Stated Pain Goal: 0 (09/17/23 0718)  Complications: No notable events documented.

## 2023-09-17 NOTE — Op Note (Signed)
Date of procedure: 09/17/2023  Date of dictation: Same  Service: Neurosurgery  Preoperative diagnosis: Adjacent segment degeneration with stenosis and radiculopathy at L4-5, status post L5-S1 fusion  Postoperative diagnosis: Same  Procedure Name: Bilateral L4-5 decompressive laminotomies and foraminotomies, more than would be required for simple interbody fusion alone.  L4-5 posterior lumbar interbody fusion utilizing interbody cages, local harvested autograft and morselized allograft  L4-5 posterior lateral thesis utilizing nonsegmental pedicle screw fixation and local autograft  Removal of L5-S1 instrumentation  Surgeon:Ermalinda Joubert A.Shondell Poulson, M.D.  Asst. Surgeon: Doran Durand, NP  Anesthesia: General  Indication: 57 year old female remotely status post L5-S1 decompression and fusion by another physician many years ago presents with worsening back and bilateral lower extremity symptoms failing conservative management workup demonstrates evidence of adjacent level disc generation with broad-based disc bulging and significant lateral recess and foraminal stenosis bilaterally.  Patient has failed conservative management presents now for decompression and fusion L4-5 in hopes improving her symptoms.  Operative note: After induction of anesthesia, patient position prone onto Wilson frame and properly padded.  Lumbar region prepped and draped sterilely.  Incision made from L4-S1.  Dissection performed bilaterally.  Retractor placed.  Fluoroscopy used.  Levels confirmed.  Previously placed pedicle screws potation was dissected free.  This was a system for which we did not have removal tools.  The titanium rod was cut and the screws were rotated out.  Fusion was inspected L5-S1 and found to be solid.  Attention then placed to the L4-5 level.  Decompressive laminotomies and facetectomies were then performed using Leksell rongeurs, Kerrison rongeurs and high-speed drill to remove the inferior two thirds the  lamina of L4 the entire inferior facet and pars interarticularis of L4 was removed.  The superior aspect of the lamina of L5 and the superior articulating processes of L5 were also resected.  Ligament flavum elevated and resected.  Foraminotomies complete on the course exiting L4 and L5 nerve roots and extends to what would be necessary for fusion alone.  Epidural venous plexus was coagulated and cut.  Bilateral discectomy was then performed.  Disc base then prepared for a body fusion.  With the distractor placed patient's right side disc base further cleaned on the left side.  A 9 mm Medtronic expandable cage was then impacted into place and expanded.  Distractor removed patient's right side.  Disc base once again prepared.  Morselized autograft packed in the interspace.  Second cage was then impacted into place and expanded.  Pedicles of L4 were identified using surface landmarks and intraoperative fluoroscopy.  Superficial bone around the pedicle was then removed using high-speed drill.  Pedicle was then probed using a pedicle awl.  Each pedicle tract was then probed and found to be solidly within the bone.  Each pedicle tract was then tapped with a screw tap.  Screw table was probed and found to be solidly within the bone.  6.5 mm Everest screws from [employer] placed bilaterally at L4.  Utilizing the prior pedicle screw holes at L5 7.5 mm Everest screws were placed bilaterally at L5.  This was performed under fluoroscopic guidance.  Final images reveal good position of the cages and the hardware at the proper level with normal alignment of spine.  Each cage was then packed with demineralized bone matrix.  Gelfoam was placed over the laminotomy defects.  Transverse processes of L4 and L5 were decorticated.  Morselized autograft was packed posterolaterally.  Short segment of titanium rod placed through the screws at L4 and L5.  Locking caps placed over the screws with locking caps then engaged with the  construct under compression.  Vancomycin powder placed in the deep wound space.  Wounds then closed in layers with Vicryl sutures.  Steri-Strips and sterile dressing were applied.  No apparent complications.  Patient tolerated the procedure well and she returns to the recovery room postop.

## 2023-09-17 NOTE — Progress Notes (Signed)
Patient alert and oriented, mae's well, voiding adequate amount of urine, swallowing without difficulty, no c/o pain at time of discharge. Patient discharged home with family. Script and discharged instructions given to patient. Patient and family stated understanding of instructions given. Patient has an appointment with DR. Pool in 2 weeks

## 2023-09-17 NOTE — Anesthesia Postprocedure Evaluation (Signed)
Anesthesia Post Note  Patient: Andrea Bailey  Procedure(s) Performed: Posterior Lumbar Interbody Fusion - Lumbar Four-Lumbar Five - Posterior Lateral and Interbody Fusion (Spine Lumbar)     Patient location during evaluation: PACU Anesthesia Type: General Level of consciousness: awake and alert Pain management: pain level controlled Vital Signs Assessment: post-procedure vital signs reviewed and stable Respiratory status: spontaneous breathing, nonlabored ventilation and respiratory function stable Cardiovascular status: blood pressure returned to baseline Postop Assessment: no apparent nausea or vomiting Anesthetic complications: no   No notable events documented.  Last Vitals:  Vitals:   09/17/23 1230 09/17/23 1257  BP: (!) 82/59 110/73  Pulse: 85 80  Resp: 12 18  Temp: 36.6 C   SpO2: 94% 99%    Last Pain:  Vitals:   09/17/23 1230  TempSrc:   PainSc: Asleep                 Shanda Howells

## 2023-09-17 NOTE — H&P (Signed)
Andrea Bailey is an 58 y.o. female.   Chief Complaint: Back pain HPI: 57 year old female who remotely status post L5-S1 decompression and fusion presents with worsening back and bilateral lower extremity symptoms failing conservative management workup demonstrates evidence of significant adjacent level degeneration with lateral recess and foraminal stenosis.  Patient presents now for L4-5 decompression and fusion in hopes improving her symptoms.  Past Medical History:  Diagnosis Date   Arthritis    ASCUS of cervix with negative high risk HPV 08/30/2022   08/30/22 repeat pap in 3 years per ASCCP guidelines 5 year risk for CIN 3+ is 0.27%   Back pain    buldging pain   Chronic neck pain    Contraceptive management 10/26/2013   Encounter for screening fecal occult blood testing 08/23/2022   Hematoma 1994   after birth of second child   Hemorrhoids 10/26/2013   History of herpes simplex type 2 infection 10/26/2013   Hormone replacement therapy (HRT) 11/08/2015   Itching in the vaginal area 10/26/2013   Left kidney mass    Leg cramps 10/26/2013   MRSA (methicillin resistant Staphylococcus aureus)    Screening mammogram for breast cancer 08/23/2022   URI (upper respiratory infection) 11/02/2014    Past Surgical History:  Procedure Laterality Date   CESAREAN SECTION  1998   along with tubal ligation   CHOLECYSTECTOMY  several yrs ago   COLONOSCOPY  09/05/2011   Procedure: COLONOSCOPY;  Surgeon: Arlyce Harman, MD;  [facility];  Service: Endoscopy;  Laterality: N/A;  11:30   COLONOSCOPY WITH PROPOFOL N/A 07/13/2022   Procedure: COLONOSCOPY WITH PROPOFOL;  Surgeon: Lanelle Bal, DO;  Location: [facility];  Service: Endoscopy;  Laterality: N/A;  2:00pm, asa 2   ENDOMETRIAL ABLATION  several yrs ago   LUMBAR LAMINECTOMY/DECOMPRESSION MICRODISCECTOMY  12/28/2011   Procedure: LUMBAR LAMINECTOMY/DECOMPRESSION MICRODISCECTOMY;  Surgeon: Reinaldo Meeker, MD;  Location: [facility];  Service: Neurosurgery;  Laterality: Right;  LUMBAR LAMINECTOMY DECOMPRESSION MICRODISCECTOMY LUMBAR 5-SACRAL ONE   NECK SURGERY  2011   NECK SURGERY  08/2016   NECK SURGERY  05/2022   ROBOTIC ASSITED PARTIAL NEPHRECTOMY Left 07/03/2023   Procedure: XI ROBOTIC ASSITED LEFT PARTIAL NEPHRECTOMY;  Surgeon: Rene Paci, MD;  Location: [facility];  Service: Urology;  Laterality: Left;  180 MINUTES    Family History  Problem Relation Age of Onset   Lung cancer Mother    Lung cancer Father    Diabetes Daughter    Cancer Sister        skin   COPD Sister    Colon cancer Neg Hx    Anesthesia problems Neg Hx    Hypotension Neg Hx    Malignant hyperthermia Neg Hx    Pseudochol deficiency Neg Hx    Social History:  reports that she has been smoking cigarettes. She has a 9 pack-year smoking history. She has never used smokeless tobacco. She reports current alcohol use. She reports that she does not use drugs.  Allergies:  Allergies  Allergen Reactions   Cymbalta [Duloxetine Hcl] Other (See Comments)    Nightmare/constipation.    Medications Prior to Admission  Medication Sig Dispense Refill   FYAVOLV 1-5 MG-MCG TABS tablet TAKE 1 TABLET BY MOUTH EVERY DAY 84 tablet 3   gabapentin (NEURONTIN) 300 MG capsule Take 300 mg by mouth 2 (two) times daily.     Magnesium 500 MG TABS Take 500 mg by mouth in the morning.  valACYclovir (VALTREX) 1000 MG tablet TAKE 1 TABLET BY MOUTH EVERY DAY 90 tablet 3    Results for orders placed or performed during the hospital encounter of 09/17/23 (from the past 48 hour(s))  CBC per protocol     Status: Abnormal   Collection Time: 09/17/23  6:13 AM  Result Value Ref Range   WBC 8.9 4.0 - 10.5 K/uL   RBC 4.09 3.87 - 5.11 MIL/uL   Hemoglobin 13.5 12.0 - 15.0 g/dL   HCT 16.1 09.6 - 04.5 %   MCV 102.4 (H) 80.0 - 100.0 fL   MCH 33.0 26.0 - 34.0 pg   MCHC 32.2 30.0 - 36.0 g/dL   RDW 40.9 81.1 - 91.4 %   Platelets 181 150 - 400 K/uL    nRBC 0.0 0.0 - 0.2 %    Comment: Performed at [facility], [address] [place], [place] [address]  Basic metabolic panel per protocol     Status: Abnormal   Collection Time: 09/17/23  6:13 AM  Result Value Ref Range   Sodium 137 135 - 145 mmol/L   Potassium 4.7 3.5 - 5.1 mmol/L    Comment: HEMOLYSIS AT THIS LEVEL MAY AFFECT RESULT   Chloride 109 98 - 111 mmol/L   CO2 18 (L) 22 - 32 mmol/L   Glucose, Bld 99 70 - 99 mg/dL    Comment: Glucose reference range applies only to samples taken after fasting for at least 8 hours.   BUN 8 6 - 20 mg/dL   Creatinine, Ser 6.21 0.44 - 1.00 mg/dL   Calcium 8.6 (L) 8.9 - 10.3 mg/dL   GFR, Estimated >30 >86 mL/min    Comment: (NOTE) Calculated using the CKD-EPI Creatinine Equation (2021)    Anion gap 10 5 - 15    Comment: Performed at [facility], [address], [place], [place] [address]  Type and screen     Status: None   Collection Time: 09/17/23  7:04 AM  Result Value Ref Range   ABO/RH(D) A POS    Antibody Screen NEG    Sample Expiration      09/20/2023,2359 Performed at [facility], [address] [place], [place] [address]    No results found.  Pertinent items noted in HPI and remainder of comprehensive ROS otherwise negative.  Blood pressure (!) 145/81, pulse 73, temperature 98.7 F (37.1 C), temperature source Oral, resp. rate 17, height 5\' 5"  (1.651 m), weight 79.4 kg, SpO2 98%.  Patient is awake and alert.  She is oriented and appropriate.  Speech is fluent.  Judgment insight are intact.  Cranial nerve function normal bilaterally motor examination reveals intact motor strength bilateral sensory examination with some decrease sensation to very light touch in her L5 dermatomes bilaterally.  Reflexes are hypoactive but symmetric.  Gait is antalgic.  Posture is reasonably normal peer examination head ears eyes nose throat is unremarkable chest and abdomen are benign.  Extremities are free from injury  deformity. Assessment/Plan L4-5 adjacent level degeneration with disc stenosis.  Plan bilateral L4-5 decompressive laminotomy and foraminotomies followed by posterior lumbar by fusion utilizing interbody cages, low curves that autograft, and coupled with posterior lateral thesis utilizing nonsegmental pedicle screw fixation and local autografting.  Risks and benefits of explained.  Patient wishes to proceed.  Karter Hellmer A Shacoya Burkhammer 09/17/2023, 8:01 AM

## 2023-09-18 ENCOUNTER — Other Ambulatory Visit (HOSPITAL_COMMUNITY): Payer: BC Managed Care – PPO

## 2023-09-20 MED FILL — Sodium Chloride IV Soln 0.9%: INTRAVENOUS | Qty: 1000 | Status: AC

## 2023-09-20 MED FILL — Heparin Sodium (Porcine) Inj 1000 Unit/ML: INTRAMUSCULAR | Qty: 30 | Status: AC

## 2023-09-30 ENCOUNTER — Encounter (HOSPITAL_BASED_OUTPATIENT_CLINIC_OR_DEPARTMENT_OTHER): Payer: Self-pay

## 2023-09-30 ENCOUNTER — Encounter (HOSPITAL_BASED_OUTPATIENT_CLINIC_OR_DEPARTMENT_OTHER): Payer: Self-pay | Admitting: Family Medicine

## 2023-09-30 ENCOUNTER — Ambulatory Visit (HOSPITAL_BASED_OUTPATIENT_CLINIC_OR_DEPARTMENT_OTHER): Payer: BC Managed Care – PPO | Admitting: Family Medicine

## 2023-09-30 VITALS — BP 128/77 | HR 92 | Ht 65.0 in | Wt 177.4 lb

## 2023-09-30 DIAGNOSIS — R609 Edema, unspecified: Secondary | ICD-10-CM | POA: Diagnosis not present

## 2023-09-30 DIAGNOSIS — R319 Hematuria, unspecified: Secondary | ICD-10-CM | POA: Diagnosis not present

## 2023-09-30 DIAGNOSIS — R3915 Urgency of urination: Secondary | ICD-10-CM

## 2023-09-30 DIAGNOSIS — E782 Mixed hyperlipidemia: Secondary | ICD-10-CM | POA: Diagnosis not present

## 2023-09-30 DIAGNOSIS — Z131 Encounter for screening for diabetes mellitus: Secondary | ICD-10-CM | POA: Diagnosis not present

## 2023-09-30 LAB — POCT URINALYSIS DIP (CLINITEK)
Bilirubin, UA: NEGATIVE
Glucose, UA: NEGATIVE mg/dL
Ketones, POC UA: NEGATIVE mg/dL
Nitrite, UA: NEGATIVE
POC PROTEIN,UA: NEGATIVE
Spec Grav, UA: 1.02 (ref 1.010–1.025)
Urobilinogen, UA: 0.2 U/dL
pH, UA: 6 (ref 5.0–8.0)

## 2023-09-30 MED ORDER — NITROFURANTOIN MONOHYD MACRO 100 MG PO CAPS: 100.0000 mg | ORAL_CAPSULE | Freq: Two times a day (BID) | ORAL | 0 refills | Status: DC

## 2023-09-30 NOTE — Patient Instructions (Signed)
Please try compression stockings 10-15 mmHg or 15-39mmHg to lower legs. Elevate legs frequently and monitor your salt intake. If no improvement, please reach out to the office.

## 2023-09-30 NOTE — Progress Notes (Signed)
Subjective:   Andrea Bailey [birth year] 09/30/2023  Chief Complaint  Patient presents with   Medical Management of Chronic Issues    6 month follow up; pt recently had a back operation about 2 weeks ago and had kidney surgery about 3 months ago. States she has had a lot of pressure with urination and is unsure if she might have a UTI.    HPI: Andrea Bailey presents today for re-assessment and management of chronic medical conditions.  HYPERLIPIDEMIA: JULLIAN ROYLANCE presents for the medical management of hyperlipidemia.  Patient is not currently taking prescribed medications for HLD.  Adhering to heathy diet: Patient eats eggs and red meat frequently. Monitors her sugar intake and drinks clear fluids daily Exercising regularly: Yes, walks 10k steps frequently  Lab Results  Component Value Date   CHOL 198 03/21/2023   HDL 51 03/21/2023   LDLCALC 109 (H) 03/21/2023   TRIG 222 (H) 03/21/2023   CHOLHDL 3.9 03/21/2023   The 10-year ASCVD risk score (Arnett DK, et al., 2019) is: 6.7%   Values used to calculate the score:     Age: 57 years     Sex: Female     Is Non-Hispanic African American: No     Diabetic: No     Tobacco smoker: Yes     Systolic Blood Pressure: 128 mmHg     Is BP treated: No     HDL Cholesterol: 51 mg/dL     Total Cholesterol: 198 mg/dL   URINARY COMPLAINT:  Patient also states she is recently s/p L4-L5 decompression and fusion as of 09/17/23 and had a left partial nephrectomy due to renal mass incidental finding during MRI for low back pain in July 2024. She states she has been having some urinary pressure since her recent spinal decompression. She did have a foley catheter following surgery and removed prior to discharge. She reports increased "urinary pressure" and increased frequency starting approx. 2 weeks ago. She reports she had slight fever approx. 3 days ago. She denies hematuria, dysuria, or incontinence of bowel or bladder. She denies saddle  paresthaia or weakness of lower extremities.    Leg Swelling:  Patient reports bilateral lower extremity edema that resolves with elevation of legs. She denies pain, rash or redness to legs. Pt states her ankles will swell during the day and resolve upon waking up in the morning. She does not use compression stockings, does not monitor salt intake. Denies recent travel.   The following portions of the patient's history were reviewed and updated as appropriate: past medical history, past surgical history, family history, social history, allergies, medications, and problem list.   Patient Active Problem List   Diagnosis Date Noted   Dependent edema 09/30/2023   Lumbar spinal stenosis due to adjacent segment disease after fusion procedure 09/17/2023   Renal mass 07/03/2023   Left kidney mass 03/28/2023   ASCUS of cervix with negative high risk HPV 08/30/2022   Transaminitis 03/06/2022   Ganglion cyst of wrist, right 01/31/2022   Cervicalgia 01/23/2022   Back pain 01/23/2022   Skin tags, multiple acquired 02/17/2018   Hormone replacement therapy (HRT) 11/08/2015   Hemorrhoids 10/26/2013   Past Medical History:  Diagnosis Date   Arthritis    ASCUS of cervix with negative high risk HPV 08/30/2022   08/30/22 repeat pap in 3 years per ASCCP guidelines 5 year risk for CIN 3+ is 0.27%   Back pain    buldging pain  Chronic neck pain    Contraceptive management 10/26/2013   Encounter for screening fecal occult blood testing 08/23/2022   Hematoma 1994   after birth of second child   Hemorrhoids 10/26/2013   History of herpes simplex type 2 infection 10/26/2013   Hormone replacement therapy (HRT) 11/08/2015   Itching in the vaginal area 10/26/2013   Left kidney mass    Leg cramps 10/26/2013   MRSA (methicillin resistant Staphylococcus aureus)    Screening mammogram for breast cancer 08/23/2022   URI (upper respiratory infection) 11/02/2014   Past Surgical History:  Procedure  Laterality Date   CESAREAN SECTION  1998   along with tubal ligation   CHOLECYSTECTOMY  several yrs ago   COLONOSCOPY  09/05/2011   Procedure: COLONOSCOPY;  Surgeon: Arlyce Harman, MD;  [facility];  Service: Endoscopy;  Laterality: N/A;  11:30   COLONOSCOPY WITH PROPOFOL N/A 07/13/2022   Procedure: COLONOSCOPY WITH PROPOFOL;  Surgeon: Lanelle Bal, DO;  [facility]: [facility];  Service: Endoscopy;  Laterality: N/A;  2:00pm, asa 2   ENDOMETRIAL ABLATION  several yrs ago   L4/L5 fusion  09/16/2023   LUMBAR LAMINECTOMY/DECOMPRESSION MICRODISCECTOMY  12/28/2011   Procedure: LUMBAR LAMINECTOMY/DECOMPRESSION MICRODISCECTOMY;  Surgeon: Reinaldo Meeker, MD;  Location: [facility];  Service: Neurosurgery;  Laterality: Right;  LUMBAR LAMINECTOMY DECOMPRESSION MICRODISCECTOMY LUMBAR 5-SACRAL ONE   NECK SURGERY  2011   NECK SURGERY  08/2016   NECK SURGERY  05/2022   ROBOTIC ASSITED PARTIAL NEPHRECTOMY Left 07/03/2023   Procedure: XI ROBOTIC ASSITED LEFT PARTIAL NEPHRECTOMY;  Surgeon: Rene Paci, MD;  Location: [facility];  Service: Urology;  Laterality: Left;  180 MINUTES   Family History  Problem Relation Age of Onset   Lung cancer Mother    Lung cancer Father    Diabetes Daughter    Cancer Sister        skin   COPD Sister    Colon cancer Neg Hx    Anesthesia problems Neg Hx    Hypotension Neg Hx    Malignant hyperthermia Neg Hx    Pseudochol deficiency Neg Hx    Outpatient Medications Prior to Visit  Medication Sig Dispense Refill   FYAVOLV 1-5 MG-MCG TABS tablet TAKE 1 TABLET BY MOUTH EVERY DAY 84 tablet 3   gabapentin (NEURONTIN) 300 MG capsule Take 300 mg by mouth 2 (two) times daily.     Magnesium 500 MG TABS Take 500 mg by mouth in the morning.     oxyCODONE 10 MG TABS Take 1 tablet (10 mg total) by mouth every 3 (three) hours as needed for severe pain (pain score 7-10) ((score 7 to 10)). 30 tablet 0   valACYclovir (VALTREX) 1000 MG tablet TAKE 1  TABLET BY MOUTH EVERY DAY 90 tablet 3   methocarbamol (ROBAXIN) 500 MG tablet Take 1 tablet (500 mg total) by mouth every 6 (six) hours as needed for muscle spasms. (Patient not taking: Reported on 09/30/2023) 40 tablet 1   No facility-administered medications prior to visit.   Allergies  Allergen Reactions   Cymbalta [Duloxetine Hcl] Other (See Comments)    Nightmare/constipation.     ROS: A complete ROS was performed with pertinent positives/negatives noted in the HPI. The remainder of the ROS are negative.    Objective:   Today's Vitals   09/30/23 0804  BP: 128/77  Pulse: 92  SpO2: 98%  Weight: 177 lb 6.4 oz (80.5 kg)  Height: 5\' 5"  (1.651 m)  Physical Exam          GENERAL: Well-appearing, in NAD. Well nourished.  SKIN: Pink, warm and dry. No rash, lesion, ulceration, or ecchymoses.  Head: Normocephalic. NECK: Trachea midline. Full ROM w/o pain or tenderness. RESPIRATORY: Chest wall symmetrical. Respirations even and non-labored. Breath sounds clear to auscultation bilaterally.  CARDIAC: S1, S2 present, regular rate and rhythm without murmur or gallops. Peripheral pulses 2+ bilaterally.  MSK: Muscle tone and strength appropriate for age. Joints w/o tenderness, redness.  GI: Abdomen soft, non-tender. No CVA tenderness.  EXTREMITIES: Without clubbing, cyanosis. Mild +1 non pitting edema to bilateral lower extremities.  NEUROLOGIC: No motor or sensory deficits. Steady, even gait. C2-C12 intact.  PSYCH/MENTAL STATUS: Alert, oriented x 3. Cooperative, appropriate mood and affect.   Health Maintenance Due  Topic Date Due   COVID-19 Vaccine (1) Never done   Zoster Vaccines- Shingrix (1 of 2) Never done    Results for orders placed or performed in visit on 09/30/23  POCT URINALYSIS DIP (CLINITEK)  Result Value Ref Range   Color, UA yellow yellow   Clarity, UA clear clear   Glucose, UA negative negative mg/dL   Bilirubin, UA negative negative   Ketones, POC UA  negative negative mg/dL   Spec Grav, UA 4.696 2.952 - 1.025   Blood, UA trace-intact (A) negative   pH, UA 6.0 5.0 - 8.0   POC PROTEIN,UA negative negative, trace   Urobilinogen, UA 0.2 0.2 or 1.0 E.U./dL   Nitrite, UA Negative Negative   Leukocytes, UA Trace (A) Negative    The 10-year ASCVD risk score (Arnett DK, et al., 2019) is: 6.7%     Assessment & Plan:  1. Mixed hyperlipidemia Will check lipids and A1c with labs today. Discussed dietary changes in regards to healthy diet and will notify of results with available.  - Lipid panel - Hemoglobin A1c  2. Urinary urgency Positive for blood and leuks. Will send for culture. GFR reviewed and greater than 60. Will start Macrobid BID x 5 days and increase clear fluids. Will repeat renal function given post partial nephrectomy with labs today.   - POCT URINALYSIS DIP (CLINITEK) - Urine Culture - Basic Metabolic Panel (BMET)  3. Dependent edema Discussed monitoring salt intake and elevating feet regularly. Recommend use of compression stocks daily. If no improvement, reach out to PCP.    Meds ordered this encounter  Medications   nitrofurantoin, macrocrystal-monohydrate, (MACROBID) 100 MG capsule    Sig: Take 1 capsule (100 mg total) by mouth 2 (two) times daily.    Dispense:  10 capsule    Refill:  0    Order Specific Question:   Supervising Provider    Answer:   DE Peru, RAYMOND J [[id number]]   Lab Orders         Urine Culture         Lipid panel         Hemoglobin A1c         Basic Metabolic Panel (BMET)         POCT URINALYSIS DIP (CLINITEK)      Return in about 6 months (around 03/30/2024) for ANNUAL PHYSICAL.    Patient to reach out to office if new, worrisome, or unresolved symptoms arise or if no improvement in patient's condition. Patient verbalized understanding and is agreeable to treatment plan. All questions answered to patient's satisfaction.   Of note, portions of this note may have been created with voice  recognition software [employer]). While this note has been edited for accuracy, occasional wrong-word or 'sound-a-like' substitutions may have occurred due to the inherent limitations of voice recognition software.  Yolanda Manges, FNP

## 2023-10-01 LAB — BASIC METABOLIC PANEL
BUN/Creatinine Ratio: 18 (ref 9–23)
BUN: 12 mg/dL (ref 6–24)
CO2: 18 mmol/L — ABNORMAL LOW (ref 20–29)
Calcium: 9.3 mg/dL (ref 8.7–10.2)
Chloride: 101 mmol/L (ref 96–106)
Creatinine, Ser: 0.68 mg/dL (ref 0.57–1.00)
Glucose: 105 mg/dL — ABNORMAL HIGH (ref 70–99)
Potassium: 4.7 mmol/L (ref 3.5–5.2)
Sodium: 137 mmol/L (ref 134–144)
eGFR: 100 mL/min/{1.73_m2} (ref >59)

## 2023-10-01 LAB — HEMOGLOBIN A1C
Est. average glucose Bld gHb Est-mCnc: 117 mg/dL
Hgb A1c MFr Bld: 5.7 % — ABNORMAL HIGH (ref 4.8–5.6)

## 2023-10-01 LAB — LIPID PANEL
Chol/HDL Ratio: 4.2 ratio (ref 0.0–4.4)
Cholesterol, Total: 200 mg/dL — ABNORMAL HIGH (ref 100–199)
HDL: 48 mg/dL (ref >39)
LDL Chol Calc (NIH): 120 mg/dL — ABNORMAL HIGH (ref 0–99)
Triglycerides: 179 mg/dL — ABNORMAL HIGH (ref 0–149)
VLDL Cholesterol Cal: 32 mg/dL (ref 5–40)

## 2023-10-02 LAB — URINE CULTURE

## 2023-10-02 NOTE — Progress Notes (Signed)
Hi Floyce,  Your kidney function and electrolytes are normal. Your urine culture did not indicate a large bacteria presence. If you continue to have symptoms after completing the Macrobid, please follow up with your Nephrologist or Urologist. Your cholesterol has increased slightly in the total cholesterol and LDL (bad cholesterol) numbers. Try to avoid greasy/fatty foods. Adhere to a diet with lean proteins (limiting red meat), vegetables, fruits, and low carbohydrates. Drink plenty of water. Exercise regularly. Your A1C does indicate prediabetes at this time. Please try to decrease sugar intake, monitoring carbohydrates as well. There is medication to help lower sugar as well if you are interested. Dr. De Peru will repeat this with your next visit. If you have further questions, please reach out to the office.

## 2023-10-09 ENCOUNTER — Other Ambulatory Visit: Payer: Self-pay | Admitting: Urology

## 2023-10-09 DIAGNOSIS — C642 Malignant neoplasm of left kidney, except renal pelvis: Secondary | ICD-10-CM

## 2023-10-14 ENCOUNTER — Other Ambulatory Visit: Payer: Self-pay | Admitting: Urology

## 2023-10-14 DIAGNOSIS — C642 Malignant neoplasm of left kidney, except renal pelvis: Secondary | ICD-10-CM

## 2023-10-18 ENCOUNTER — Other Ambulatory Visit: Payer: Self-pay | Admitting: Adult Health

## 2023-10-24 ENCOUNTER — Ambulatory Visit
Admission: RE | Admit: 2023-10-24 | Discharge: 2023-10-24 | Disposition: A | Discharge status: Home / Self Care | Payer: BC Managed Care – PPO | Source: Ambulatory Visit | Attending: Urology | Admitting: Urology

## 2023-10-24 ENCOUNTER — Encounter (HOSPITAL_BASED_OUTPATIENT_CLINIC_OR_DEPARTMENT_OTHER): Payer: Self-pay | Admitting: Family Medicine

## 2023-10-24 DIAGNOSIS — C642 Malignant neoplasm of left kidney, except renal pelvis: Secondary | ICD-10-CM

## 2023-10-24 MED ORDER — IOPAMIDOL (ISOVUE-300) INJECTION 61%
100.0000 mL | Freq: Once | INTRAVENOUS | Status: AC | PRN
Start: 2023-10-24 — End: 2023-10-24
  Administered 2023-10-24: 100 mL via INTRAVENOUS

## 2023-11-11 DIAGNOSIS — C642 Malignant neoplasm of left kidney, except renal pelvis: Secondary | ICD-10-CM | POA: Diagnosis not present

## 2023-11-18 DIAGNOSIS — C642 Malignant neoplasm of left kidney, except renal pelvis: Secondary | ICD-10-CM | POA: Diagnosis not present

## 2024-03-30 ENCOUNTER — Other Ambulatory Visit (HOSPITAL_BASED_OUTPATIENT_CLINIC_OR_DEPARTMENT_OTHER): Payer: Self-pay

## 2024-03-30 ENCOUNTER — Ambulatory Visit (INDEPENDENT_AMBULATORY_CARE_PROVIDER_SITE_OTHER): Payer: BC Managed Care – PPO | Admitting: Family Medicine

## 2024-03-30 ENCOUNTER — Encounter (HOSPITAL_BASED_OUTPATIENT_CLINIC_OR_DEPARTMENT_OTHER): Payer: Self-pay

## 2024-03-30 ENCOUNTER — Encounter (HOSPITAL_BASED_OUTPATIENT_CLINIC_OR_DEPARTMENT_OTHER): Payer: Self-pay | Admitting: Family Medicine

## 2024-03-30 VITALS — BP 135/81 | HR 89 | Ht 65.0 in | Wt 186.7 lb

## 2024-03-30 DIAGNOSIS — Z0000 Encounter for general adult medical examination without abnormal findings: Secondary | ICD-10-CM

## 2024-03-30 DIAGNOSIS — R7303 Prediabetes: Secondary | ICD-10-CM | POA: Insufficient documentation

## 2024-03-30 DIAGNOSIS — E66811 Obesity, class 1: Secondary | ICD-10-CM | POA: Diagnosis not present

## 2024-03-30 MED ORDER — WEGOVY 0.25 MG/0.5ML ~~LOC~~ SOAJ
0.2500 mg | SUBCUTANEOUS | 1 refills | Status: DC
  Filled 2024-03-30 – 2024-03-31 (×2): qty 2, 28d supply, fill #0

## 2024-03-30 NOTE — Assessment & Plan Note (Signed)
 Long discussion today reviewing weight loss medications including injectable options including GLP-1 receptor agonist, oral agents including Contrave, orlistat, phentermine.  We discussed potential risk, benefits, cost associated with these various medications as well as the possibility of insurance coverage or lack thereof.  We discussed typical dosing regimen for both injectable and oral medications, proper administration of each.  We discussed potential outcomes with each. After discussion of potential risks and adverse reactions with these medications and potential benefits of each, patient would like to proceed with injectable GLP-1 receptor agonist if possible.  Prescription sent to pharmacy on file. Additional consideration is for referral to healthy weight and wellness clinic

## 2024-03-30 NOTE — Progress Notes (Signed)
 Subjective:    CC: Annual Physical Exam  HPI: Andrea Bailey is a 58 y.o. presenting for annual physical  I reviewed the past medical history, family history, social history, surgical history, and allergies today and no changes were needed.  Please see the problem list section below in epic for further details.  Past Medical History: Past Medical History:  Diagnosis Date   Arthritis    ASCUS of cervix with negative high risk HPV 08/30/2022   08/30/22 repeat pap in 3 years per ASCCP guidelines 5 year risk for CIN 3+ is 0.27%   Back pain    buldging pain   Chronic neck pain    Contraceptive management 10/26/2013   Encounter for screening fecal occult blood testing 08/23/2022   Hematoma 1994   after birth of second child   Hemorrhoids 10/26/2013   History of herpes simplex type 2 infection 10/26/2013   Hormone replacement therapy (HRT) 11/08/2015   Itching in the vaginal area 10/26/2013   Left kidney mass    Leg cramps 10/26/2013   MRSA (methicillin resistant Staphylococcus aureus)    Screening mammogram for breast cancer 08/23/2022   URI (upper respiratory infection) 11/02/2014   Past Surgical History: Past Surgical History:  Procedure Laterality Date   CESAREAN SECTION  1998   along with tubal ligation   CHOLECYSTECTOMY  several yrs ago   COLONOSCOPY  09/05/2011   Procedure: COLONOSCOPY;  Surgeon: Pauleen Borne, MD;  [facility]: [facility];  Service: Endoscopy;  Laterality: N/A;  11:30   COLONOSCOPY WITH PROPOFOL  N/A 07/13/2022   Procedure: COLONOSCOPY WITH PROPOFOL ;  Surgeon: Vinetta Greening, DO;  Location: [facility];  Service: Endoscopy;  Laterality: N/A;  2:00pm, asa 2   ENDOMETRIAL ABLATION  several yrs ago   L4/L5 fusion  09/16/2023   LUMBAR LAMINECTOMY/DECOMPRESSION MICRODISCECTOMY  12/28/2011   Procedure: LUMBAR LAMINECTOMY/DECOMPRESSION MICRODISCECTOMY;  Surgeon: Augustine Blocker, MD;  Location: [facility];  Service: Neurosurgery;  Laterality: Right;  LUMBAR  LAMINECTOMY DECOMPRESSION MICRODISCECTOMY LUMBAR 5-SACRAL ONE   NECK SURGERY  2011   NECK SURGERY  08/2016   NECK SURGERY  05/2022   ROBOTIC ASSITED PARTIAL NEPHRECTOMY Left 07/03/2023   Procedure: XI ROBOTIC ASSITED LEFT PARTIAL NEPHRECTOMY;  Surgeon: Adelbert Homans, MD;  Location: [facility];  Service: Urology;  Laterality: Left;  180 MINUTES   Social History: Social History   Socioeconomic History   Marital status: Divorced    Spouse name: Not on file   Number of children: Not on file   Years of education: Not on file   Highest education level: Some college, no degree  Occupational History   Occupation: Emergency planning/management officer  Tobacco Use   Smoking status: Every Day    Current packs/day: 1.00    Average packs/day: 1 pack/day for 9.0 years (9.0 ttl pk-yrs)    Types: Cigarettes    Passive exposure: Current   Smokeless tobacco: Never  Vaping Use   Vaping status: Never Used  Substance and Sexual Activity   Alcohol use: Yes    Comment: rarely   Drug use: No   Sexual activity: Yes    Birth control/protection: Surgical, Post-menopausal    Comment: endometrial ablation  Other Topics Concern   Not on file  Social History Narrative   Not on file   Social Drivers of Health   Financial Resource Strain: Low Risk  (03/27/2024)   Overall Financial Resource Strain (CARDIA)    Difficulty of Paying Living Expenses: Not hard at all  Food Insecurity: No Food Insecurity (03/27/2024)   Hunger Vital Sign    Worried About Running Out of Food in the Last Year: Never true    Ran Out of Food in the Last Year: Never true  Transportation Needs: No Transportation Needs (03/27/2024)   PRAPARE - Administrator, Civil Service (Medical): No    Lack of Transportation (Non-Medical): No  Physical Activity: Sufficiently Active (03/27/2024)   Exercise Vital Sign    Days of Exercise per Week: 4 days    Minutes of Exercise per Session: 150+ min  Stress: No Stress Concern Present (03/27/2024)   [employer] of Occupational [employer] - Occupational Stress Questionnaire    Feeling of Stress : Only a little  Social Connections: Socially Isolated (03/27/2024)   Social Connection and Isolation Panel [NHANES]    Frequency of Communication with Friends and Family: More than three times a week    Frequency of Social Gatherings with Friends and Family: Three times a week    Attends Religious Services: Never    Active Member of Clubs or Organizations: No    Attends Engineer, structural: Not on file    Marital Status: Divorced   Family History: Family History  Problem Relation Age of Onset   Lung cancer Mother    Lung cancer Father    Diabetes Daughter    Cancer Sister        skin   COPD Sister    Colon cancer Neg Hx    Anesthesia problems Neg Hx    Hypotension Neg Hx    Malignant hyperthermia Neg Hx    Pseudochol deficiency Neg Hx    Allergies: Allergies  Allergen Reactions   Cymbalta [Duloxetine Hcl] Other (See Comments)    Nightmare/constipation.   Medications: See med rec.  Review of Systems: No headache, visual changes, nausea, vomiting, diarrhea, constipation, dizziness, abdominal pain, skin rash, fevers, chills, night sweats, swollen lymph nodes, weight loss, chest pain, body aches, joint swelling, muscle aches, shortness of breath, mood changes, visual or auditory hallucinations.  Objective:    BP 135/81 (BP Location: Right Arm, Patient Position: Sitting, Cuff Size: Normal)   Pulse 89   Ht 5\' 5"  (1.651 m)   Wt 186 lb 11.2 oz (84.7 kg)   SpO2 98%   BMI 31.07 kg/m   General: Well Developed, well nourished, and in no acute distress. Neuro: Alert and oriented x3, extra-ocular muscles intact, sensation grossly intact. Cranial nerves II through XII are intact, motor, sensory, and coordinative functions are all intact. HEENT: Normocephalic, atraumatic, pupils equal round reactive to light, neck supple, no masses, no lymphadenopathy, thyroid nonpalpable. Oropharynx,  nasopharynx, external ear canals are unremarkable. Skin: Warm and dry, no rashes noted. Cardiac: Regular rate and rhythm, no murmurs rubs or gallops. Respiratory: Clear to auscultation bilaterally. Not using accessory muscles, speaking in full sentences. Abdominal: Soft, nontender, nondistended, positive bowel sounds, no masses, no organomegaly. Musculoskeletal: Shoulder, elbow, wrist, hip, knee, ankle stable, and with full range of motion.  Impression and Recommendations:    Wellness examination Assessment & Plan: Routine HCM labs ordered. HCM reviewed/discussed. Anticipatory guidance regarding healthy weight, lifestyle and choices given. Recommend healthy diet.  Recommend approximately 150 minutes/week of moderate intensity exercise Recommend regular dental and vision exams Always use seatbelt/lap and shoulder restraints Recommend using smoke alarms and checking batteries at least twice a year Recommend using sunscreen when outside Discussed colon cancer screening recommendations, options.  Patient is UTD Discussed recommendations for shingles  vaccine.  Patient will consider Discussed immunization recommendations, declines today  Orders: -     CBC with Differential/Platelet -     Comprehensive metabolic panel with GFR -     Hemoglobin A1c -     Lipid panel -     TSH Rfx on Abnormal to Free T4  Obesity, Class I, BMI 30-34.9 Assessment & Plan: Long discussion today reviewing weight loss medications including injectable options including GLP-1 receptor agonist, oral agents including Contrave, orlistat, phentermine.  We discussed potential risk, benefits, cost associated with these various medications as well as the possibility of insurance coverage or lack thereof.  We discussed typical dosing regimen for both injectable and oral medications, proper administration of each.  We discussed potential outcomes with each. After discussion of potential risks and adverse reactions with these  medications and potential benefits of each, patient would like to proceed with injectable GLP-1 receptor agonist if possible.  Prescription sent to pharmacy on file Additional consideration is for referral to healthy weight and wellness clinic  Orders: -     ZOXWRU; Inject 0.25 mg into the skin once a week. Use this dose for 1 month (4 shots) and then increase to next higher dose.  Dispense: 2 mL; Refill: 1  Prediabetes -     EAVWUJ; Inject 0.25 mg into the skin once a week. Use this dose for 1 month (4 shots) and then increase to next higher dose.  Dispense: 2 mL; Refill: 1  Return if symptoms worsen or fail to improve.   ___________________________________________ Nickolette Espinola de [place], MD, ABFM, CAQSM Primary Care and Sports Medicine [facility]

## 2024-03-30 NOTE — Assessment & Plan Note (Signed)
 Routine HCM labs ordered. HCM reviewed/discussed. Anticipatory guidance regarding healthy weight, lifestyle and choices given. Recommend healthy diet.  Recommend approximately 150 minutes/week of moderate intensity exercise Recommend regular dental and vision exams Always use seatbelt/lap and shoulder restraints Recommend using smoke alarms and checking batteries at least twice a year Recommend using sunscreen when outside Discussed colon cancer screening recommendations, options.  Patient is UTD Discussed recommendations for shingles vaccine.  Patient will consider Discussed immunization recommendations, declines today

## 2024-03-30 NOTE — Patient Instructions (Signed)
  Medication Instructions:  Your physician recommends that you continue on your current medications as directed. Please refer to the Current Medication list given to you today. --If you need a refill on any your medications before your next appointment, please call your pharmacy first. If no refills are authorized on file call the office.-- Lab Work: Your physician has recommended that you have lab work today: today If you have labs (blood work) drawn today and your tests are completely normal, you will receive your results via MyChart message OR a phone call from our staff.  Please ensure you check your voicemail in the event that you authorized detailed messages to be left on a delegated number. If you have any lab test that is abnormal or we need to change your treatment, we will call you to review the results.   Follow-Up: Your next appointment:   Your physician recommends that you schedule a follow-up appointment in: as needed with Dr. de Peru  You will receive a text message or e-mail with a link to a survey about your care and experience with [place] today! We would greatly appreciate your feedback!   Thanks for letting us be apart of your health journey!!  Primary Care and Sports Medicine   Dr. Ceasar Mons Peru   We encourage you to activate your patient portal called "MyChart".  Sign up information is provided on this After Visit Summary.  MyChart is used to connect with patients for Virtual Visits (Telemedicine).  Patients are able to view lab/test results, encounter notes, upcoming appointments, etc.  Non-urgent messages can be sent to your provider as well. To learn more about what you can do with MyChart, please visit --  [email or web address].

## 2024-03-31 ENCOUNTER — Other Ambulatory Visit (HOSPITAL_BASED_OUTPATIENT_CLINIC_OR_DEPARTMENT_OTHER): Payer: Self-pay

## 2024-03-31 ENCOUNTER — Telehealth (HOSPITAL_BASED_OUTPATIENT_CLINIC_OR_DEPARTMENT_OTHER): Payer: Self-pay | Admitting: *Deleted

## 2024-03-31 ENCOUNTER — Encounter (HOSPITAL_BASED_OUTPATIENT_CLINIC_OR_DEPARTMENT_OTHER): Payer: Self-pay | Admitting: Family Medicine

## 2024-03-31 DIAGNOSIS — E782 Mixed hyperlipidemia: Secondary | ICD-10-CM

## 2024-03-31 DIAGNOSIS — R7303 Prediabetes: Secondary | ICD-10-CM

## 2024-03-31 DIAGNOSIS — E66811 Obesity, class 1: Secondary | ICD-10-CM

## 2024-03-31 LAB — COMPREHENSIVE METABOLIC PANEL WITH GFR
ALT: 26 IU/L (ref 0–32)
AST: 25 IU/L (ref 0–40)
Albumin: 4.3 g/dL (ref 3.8–4.9)
Alkaline Phosphatase: 88 IU/L (ref 44–121)
BUN/Creatinine Ratio: 12 (ref 9–23)
BUN: 9 mg/dL (ref 6–24)
Bilirubin Total: 0.3 mg/dL (ref 0.0–1.2)
CO2: 20 mmol/L (ref 20–29)
Calcium: 9.3 mg/dL (ref 8.7–10.2)
Chloride: 103 mmol/L (ref 96–106)
Creatinine, Ser: 0.75 mg/dL (ref 0.57–1.00)
Globulin, Total: 2.7 g/dL (ref 1.5–4.5)
Glucose: 110 mg/dL — ABNORMAL HIGH (ref 70–99)
Potassium: 4.6 mmol/L (ref 3.5–5.2)
Sodium: 140 mmol/L (ref 134–144)
Total Protein: 7 g/dL (ref 6.0–8.5)
eGFR: 92 mL/min/{1.73_m2} (ref >59)

## 2024-03-31 LAB — LIPID PANEL
Chol/HDL Ratio: 3.9 ratio (ref 0.0–4.4)
Cholesterol, Total: 195 mg/dL (ref 100–199)
HDL: 50 mg/dL (ref >39)
LDL Chol Calc (NIH): 104 mg/dL — ABNORMAL HIGH (ref 0–99)
Triglycerides: 241 mg/dL — ABNORMAL HIGH (ref 0–149)
VLDL Cholesterol Cal: 41 mg/dL — ABNORMAL HIGH (ref 5–40)

## 2024-03-31 LAB — CBC WITH DIFFERENTIAL/PLATELET
Basophils Absolute: 0.1 10*3/uL (ref 0.0–0.2)
Basos: 1 %
EOS (ABSOLUTE): 0.2 10*3/uL (ref 0.0–0.4)
Eos: 2 %
Hematocrit: 41.2 % (ref 34.0–46.6)
Hemoglobin: 13.9 g/dL (ref 11.1–15.9)
Immature Grans (Abs): 0.1 10*3/uL (ref 0.0–0.1)
Immature Granulocytes: 1 %
Lymphocytes Absolute: 2.5 10*3/uL (ref 0.7–3.1)
Lymphs: 25 %
MCH: 33.3 pg — ABNORMAL HIGH (ref 26.6–33.0)
MCHC: 33.7 g/dL (ref 31.5–35.7)
MCV: 99 fL — ABNORMAL HIGH (ref 79–97)
Monocytes Absolute: 0.7 10*3/uL (ref 0.1–0.9)
Monocytes: 7 %
Neutrophils Absolute: 6.5 10*3/uL (ref 1.4–7.0)
Neutrophils: 64 %
Platelets: 202 10*3/uL (ref 150–450)
RBC: 4.18 x10E6/uL (ref 3.77–5.28)
RDW: 12.8 % (ref 11.7–15.4)
WBC: 10 10*3/uL (ref 3.4–10.8)

## 2024-03-31 LAB — HEMOGLOBIN A1C
Est. average glucose Bld gHb Est-mCnc: 120 mg/dL
Hgb A1c MFr Bld: 5.8 % — ABNORMAL HIGH (ref 4.8–5.6)

## 2024-03-31 LAB — TSH RFX ON ABNORMAL TO FREE T4: TSH: 2.76 u[IU]/mL (ref 0.450–4.500)

## 2024-03-31 NOTE — Telephone Encounter (Signed)
 Copied from CRM [id number]. Topic: Clinical - Prescription Issue >> Mar 31, 2024  3:27 PM Lorrane Rosette wrote: Reason for CRM: Patient called to report that she will not be taking the [facility]. Patient mentioned something about the cost but ultimately stated that she wanted to make the doctor aware that she would not be taking the medication.

## 2024-03-31 NOTE — Telephone Encounter (Signed)
 fyi

## 2024-04-02 NOTE — Telephone Encounter (Signed)
 Patient is okay with starting zepbound . Please send to the pharmacy

## 2024-04-03 ENCOUNTER — Other Ambulatory Visit (HOSPITAL_BASED_OUTPATIENT_CLINIC_OR_DEPARTMENT_OTHER): Payer: Self-pay

## 2024-04-03 MED ORDER — ZEPBOUND 2.5 MG/0.5ML ~~LOC~~ SOAJ
2.5000 mg | SUBCUTANEOUS | 1 refills | Status: DC
  Filled 2024-04-03 – 2024-04-07 (×3): qty 2, 28d supply, fill #0

## 2024-04-03 NOTE — Addendum Note (Signed)
 Addended by: DE Peru, Jaxsin Bottomley J on: 04/03/2024 08:15 AM   Modules accepted: Orders

## 2024-04-07 ENCOUNTER — Telehealth (HOSPITAL_BASED_OUTPATIENT_CLINIC_OR_DEPARTMENT_OTHER): Payer: Self-pay | Admitting: *Deleted

## 2024-04-07 ENCOUNTER — Other Ambulatory Visit (HOSPITAL_BASED_OUTPATIENT_CLINIC_OR_DEPARTMENT_OTHER): Payer: Self-pay

## 2024-04-07 ENCOUNTER — Other Ambulatory Visit (HOSPITAL_COMMUNITY): Payer: Self-pay

## 2024-04-07 NOTE — Telephone Encounter (Signed)
 Initiated prior auth for zepbound  per [employer] drug plan does not cover drug

## 2024-04-10 ENCOUNTER — Other Ambulatory Visit (HOSPITAL_BASED_OUTPATIENT_CLINIC_OR_DEPARTMENT_OTHER): Payer: Self-pay

## 2024-04-13 ENCOUNTER — Other Ambulatory Visit (HOSPITAL_BASED_OUTPATIENT_CLINIC_OR_DEPARTMENT_OTHER): Payer: Self-pay

## 2024-04-23 ENCOUNTER — Other Ambulatory Visit (HOSPITAL_BASED_OUTPATIENT_CLINIC_OR_DEPARTMENT_OTHER): Payer: Self-pay

## 2024-05-05 ENCOUNTER — Encounter: Payer: Self-pay | Admitting: Emergency Medicine

## 2024-05-05 ENCOUNTER — Ambulatory Visit
Admission: EM | Admit: 2024-05-05 | Discharge: 2024-05-05 | Disposition: A | Discharge status: Home / Self Care | Attending: Nurse Practitioner | Admitting: Nurse Practitioner

## 2024-05-05 DIAGNOSIS — Z8739 Personal history of other diseases of the musculoskeletal system and connective tissue: Secondary | ICD-10-CM | POA: Diagnosis not present

## 2024-05-05 DIAGNOSIS — M545 Low back pain, unspecified: Secondary | ICD-10-CM

## 2024-05-05 MED ORDER — TIZANIDINE HCL 4 MG PO TABS: 4.0000 mg | ORAL_TABLET | Freq: Three times a day (TID) | ORAL | 0 refills | Status: AC | PRN

## 2024-05-05 MED ORDER — PREDNISONE 20 MG PO TABS: 40.0000 mg | ORAL_TABLET | Freq: Every day | ORAL | 0 refills | Status: AC

## 2024-05-05 NOTE — ED Provider Notes (Signed)
 [facility]    CSN: [id number] Arrival date & time: 05/05/24  1200      History   Chief Complaint No chief complaint on file.   HPI Andrea Bailey is a 58 y.o. female.   The history is provided by the patient.   Patient presents for complaints of right-sided low back pain.  Patient with underlying history of chronic back pain, patient with history of lumbar surgery in October 2024.  Patient states approximately 2 weeks ago, she was bending over to shave her legs when the pain started.  She states since that time, the pain has been persistent.  Patient states pain worsens when she is sitting and attempting to stand.  She states that the pain radiates down to the top of the left leg.  Patient denies numbness, tingling, radiation of pain, loss of bowel or bladder function, or the inability to bear weight.  Patient states she was concerned about the "hardware" in her back.  Patient states she has been taking Tylenol  for her symptoms with minimal relief.  Patient states she did reach out to her surgeons office, but they did not have an appointment until July.  Past Medical History:  Diagnosis Date   Arthritis    ASCUS of cervix with negative high risk HPV 08/30/2022   08/30/22 repeat pap in 3 years per ASCCP guidelines 5 year risk for CIN 3+ is 0.27%   Back pain    buldging pain   Chronic neck pain    Contraceptive management 10/26/2013   Encounter for screening fecal occult blood testing 08/23/2022   Hematoma 1994   after birth of second child   Hemorrhoids 10/26/2013   History of herpes simplex type 2 infection 10/26/2013   Hormone replacement therapy (HRT) 11/08/2015   Itching in the vaginal area 10/26/2013   Left kidney mass    Leg cramps 10/26/2013   MRSA (methicillin resistant Staphylococcus aureus)    Screening mammogram for breast cancer 08/23/2022   URI (upper respiratory infection) 11/02/2014    Patient Active Problem List   Diagnosis Date Noted   Obesity,  Class I, BMI 30-34.9 03/30/2024   Prediabetes 03/30/2024   Dependent edema 09/30/2023   Lumbar spinal stenosis due to adjacent segment disease after fusion procedure 09/17/2023   Renal mass 07/03/2023   Left kidney mass 03/28/2023   ASCUS of cervix with negative high risk HPV 08/30/2022   Wellness examination 03/06/2022   Transaminitis 03/06/2022   Ganglion cyst of wrist, right 01/31/2022   Cervicalgia 01/23/2022   Back pain 01/23/2022   Spondylosis without myelopathy or radiculopathy, cervical region 07/11/2018   Skin tags, multiple acquired 02/17/2018   Displacement of cervical intervertebral disc without myelopathy 07/25/2016   Hormone replacement therapy (HRT) 11/08/2015   Hemorrhoids 10/26/2013    Past Surgical History:  Procedure Laterality Date   CESAREAN SECTION  1998   along with tubal ligation   CHOLECYSTECTOMY  several yrs ago   COLONOSCOPY  09/05/2011   Procedure: COLONOSCOPY;  Surgeon: Pauleen Borne, MD;  [facility]: [facility];  Service: Endoscopy;  Laterality: N/A;  11:30   COLONOSCOPY WITH PROPOFOL  N/A 07/13/2022   Procedure: COLONOSCOPY WITH PROPOFOL ;  Surgeon: Vinetta Greening, DO;  Location: [facility];  Service: Endoscopy;  Laterality: N/A;  2:00pm, asa 2   ENDOMETRIAL ABLATION  several yrs ago   L4/L5 fusion  09/16/2023   LUMBAR LAMINECTOMY/DECOMPRESSION MICRODISCECTOMY  12/28/2011   Procedure: LUMBAR LAMINECTOMY/DECOMPRESSION MICRODISCECTOMY;  Surgeon: Thomos Flies Kritzer,  MD;  Location: [facility];  Service: Neurosurgery;  Laterality: Right;  LUMBAR LAMINECTOMY DECOMPRESSION MICRODISCECTOMY LUMBAR 5-SACRAL ONE   NECK SURGERY  2011   NECK SURGERY  08/2016   NECK SURGERY  05/2022   ROBOTIC ASSITED PARTIAL NEPHRECTOMY Left 07/03/2023   Procedure: XI ROBOTIC ASSITED LEFT PARTIAL NEPHRECTOMY;  Surgeon: Adelbert Homans, MD;  Location: [facility];  Service: Urology;  Laterality: Left;  180 MINUTES    OB History     Gravida  3   Para  3   Term       Preterm      AB      Living  3      SAB      IAB      Ectopic      Multiple      Live Births  3            Home Medications    Prior to Admission medications   Medication Sig Start Date End Date Taking? Authorizing Provider  predniSONE  (DELTASONE ) 20 MG tablet Take 2 tablets (40 mg total) by mouth daily with breakfast for 5 days. 05/05/24 05/10/24 Yes Leath-Warren, Belen Bowers, NP  tiZANidine  (ZANAFLEX ) 4 MG tablet Take 1 tablet (4 mg total) by mouth every 8 (eight) hours as needed for muscle spasms. 05/05/24  Yes Leath-Warren, Belen Bowers, NP  FYAVOLV  1-5 MG-MCG TABS tablet TAKE 1 TABLET BY MOUTH EVERY DAY 10/18/23   Lendia Quay A, NP  Magnesium  500 MG TABS Take 500 mg by mouth in the morning.    [provider]  valACYclovir  (VALTREX ) 1000 MG tablet TAKE 1 TABLET BY MOUTH EVERY DAY 09/03/23   Javan Messing, NP    Family History Family History  Problem Relation Age of Onset   Lung cancer Mother    Lung cancer Father    Diabetes Daughter    Cancer Sister        skin   COPD Sister    Colon cancer Neg Hx    Anesthesia problems Neg Hx    Hypotension Neg Hx    Malignant hyperthermia Neg Hx    Pseudochol deficiency Neg Hx     Social History Social History   Tobacco Use   Smoking status: Every Day    Current packs/day: 1.00    Average packs/day: 1 pack/day for 9.0 years (9.0 ttl pk-yrs)    Types: Cigarettes    Passive exposure: Current   Smokeless tobacco: Never  Vaping Use   Vaping status: Never Used  Substance Use Topics   Alcohol use: Yes    Comment: rarely   Drug use: No     Allergies   Cymbalta [duloxetine hcl]   Review of Systems Review of Systems Per HPI  Physical Exam Triage Vital Signs ED Triage Vitals  Encounter Vitals Group     BP 05/05/24 1219 (!) 144/84     Systolic BP Percentile --      Diastolic BP Percentile --      Pulse Rate 05/05/24 1219 86     Resp 05/05/24 1219 16     Temp 05/05/24 1219 99.1 F (37.3  C)     Temp Source 05/05/24 1219 Oral     SpO2 05/05/24 1219 93 %     Weight --      Height --      Head Circumference --      Peak Flow --      Pain Score 05/05/24  1221 7     Pain Loc --      Pain Education --      Exclude from Growth Chart --    No data found.  Updated Vital Signs BP (!) 144/84 (BP Location: Right Arm)   Pulse 86   Temp 99.1 F (37.3 C) (Oral)   Resp 16   SpO2 93%   Visual Acuity Right Eye Distance:   Left Eye Distance:   Bilateral Distance:    Right Eye Near:   Left Eye Near:    Bilateral Near:     Physical Exam Vitals and nursing note reviewed.  Constitutional:      General: She is not in acute distress.    Appearance: Normal appearance.  HENT:     Head: Normocephalic.  Eyes:     Extraocular Movements: Extraocular movements intact.     Pupils: Pupils are equal, round, and reactive to light.  Cardiovascular:     Rate and Rhythm: Normal rate and regular rhythm.  Pulmonary:     Effort: Pulmonary effort is normal.     Breath sounds: Normal breath sounds.  Abdominal:     General: Bowel sounds are normal.     Palpations: Abdomen is soft.  Musculoskeletal:     Cervical back: Normal range of motion.     Lumbar back: No swelling, edema, deformity, signs of trauma or spasms. Decreased range of motion.       Back:  Skin:    General: Skin is warm and dry.  Neurological:     General: No focal deficit present.     Mental Status: She is alert and oriented to person, place, and time.  Psychiatric:        Mood and Affect: Mood normal.        Behavior: Behavior normal.      UC Treatments / Results  Labs (all labs ordered are listed, but only abnormal results are displayed) Labs Reviewed - No data to display  EKG   Radiology No results found.  Procedures Procedures (including critical care time)  Medications Ordered in UC Medications - No data to display  Initial Impression / Assessment and Plan / UC Course  I have reviewed the  triage vital signs and the nursing notes.  Pertinent labs & imaging results that were available during my care of the patient were reviewed by me and considered in my medical decision making (see chart for details).  Patient was offered the opportunity to go to [facility] for imaging.  Patient declined stating she would like to follow-up in this clinic on 6/4 for an x-ray.  Patient was advised that this would be a another appointment, she confirmed that was fine.  Difficult to ascertain the cause of the patient's back pain given her history and recent back surgery.  Will provide symptomatic treatment with prednisone  40 mg for the next 5 days to help with possible inflammation and tizanidine  4 mg to help with pain.  Supportive care recommendations were provided and discussed with the patient to include over-the-counter analgesics, the use of ice or heat, and stretching exercises.  Patient was advised to continue to follow-up with her neurosurgeon regarding a follow-up appointment.  Patient was given strict ER follow-up precautions.  Patient was in agreement with this plan of care and verbalizes understanding.  All questions were answered.  Patient stable for discharge.   Final Clinical Impressions(s) / UC Diagnoses   Final diagnoses:  Right-sided low back pain without  sciatica, unspecified chronicity  History of back pain     Discharge Instructions      Take medication as prescribed. Try to remain as active as possible. Gentle range of motion and stretching exercises to help with back spasm and pain. May apply ice or heat as needed.  Ice is recommended for pain or swelling, heat for spasm or stiffness.  Apply for 20 minutes, remove for 1 hour, then repeat. Recommend over-the-counter Tylenol  for pain or discomfort. Go to the emergency department immediately if you develop weakness in your legs or feet, inability to walk, loss of bowel or bladder function, difficulty urinating or passing  a bowel movement, or other concerns. Follow-up with neurosurgery for further evaluation. Follow-up as needed.    ED Prescriptions     Medication Sig Dispense Auth. Provider   predniSONE  (DELTASONE ) 20 MG tablet Take 2 tablets (40 mg total) by mouth daily with breakfast for 5 days. 10 tablet Leath-Warren, Belen Bowers, NP   tiZANidine  (ZANAFLEX ) 4 MG tablet Take 1 tablet (4 mg total) by mouth every 8 (eight) hours as needed for muscle spasms. 30 tablet Leath-Warren, Belen Bowers, NP      PDMP not reviewed this encounter.   Hardy Lia, NP 05/05/24 1242

## 2024-05-05 NOTE — ED Triage Notes (Signed)
 Felt pain in back while shaving legs 2 weeks ago. Felts pain on right lower side and top of right leg.

## 2024-05-05 NOTE — Discharge Instructions (Signed)
 Take medication as prescribed. Try to remain as active as possible. Gentle range of motion and stretching exercises to help with back spasm and pain. May apply ice or heat as needed.  Ice is recommended for pain or swelling, heat for spasm or stiffness.  Apply for 20 minutes, remove for 1 hour, then repeat. Recommend over-the-counter Tylenol  for pain or discomfort. Go to the emergency department immediately if you develop weakness in your legs or feet, inability to walk, loss of bowel or bladder function, difficulty urinating or passing a bowel movement, or other concerns. Follow-up with neurosurgery for further evaluation. Follow-up as needed.

## 2024-05-06 ENCOUNTER — Ambulatory Visit: Payer: Self-pay | Admitting: Physician Assistant

## 2024-05-06 ENCOUNTER — Ambulatory Visit
Admission: RE | Admit: 2024-05-06 | Discharge: 2024-05-06 | Disposition: A | Discharge status: Home / Self Care | Source: Ambulatory Visit | Attending: Physician Assistant | Admitting: Physician Assistant

## 2024-05-06 ENCOUNTER — Ambulatory Visit (INDEPENDENT_AMBULATORY_CARE_PROVIDER_SITE_OTHER)

## 2024-05-06 VITALS — BP 136/81 | HR 94 | Temp 98.6°F | Resp 18

## 2024-05-06 DIAGNOSIS — M5441 Lumbago with sciatica, right side: Secondary | ICD-10-CM | POA: Diagnosis not present

## 2024-05-06 MED ORDER — LIDOCAINE 5 % EX PTCH
1.0000 | MEDICATED_PATCH | CUTANEOUS | 0 refills | Status: AC
Start: 2024-05-06 — End: ?

## 2024-05-06 NOTE — ED Provider Notes (Signed)
 [facility]    CSN: [id number] Arrival date & time: 05/06/24  0865      History   Chief Complaint No chief complaint on file.   HPI Andrea Bailey is a 58 y.o. female.   Patient returns today for lumbar spine x-ray.  She was seen yesterday (05/05/2024) at which point we did not have x-ray and she declined to go to [facility] to have outpatient x-rays obtained and instead returned here today.  She has a history of chronic back pain and underwent lumbar fusion in October 2024.  Approximately 2 weeks ago she was leaning forward to shave her legs when she felt pain in her lower back.  She has had ongoing pain in the right side of her lower back that radiates into her right leg since that time.  Pain is worse with prolonged standing, changing position from sitting to standing, prolonged ambulation.  Pain is rated 7 on a 0-10 pain scale, described as aching, no aggravating or alleviating factors identified.  When she was seen yesterday she was started on prednisone  as well as Zanaflex .  She reports that she did not start the prednisone  until this morning due to concern for insomnia and so started it today.  She did sleep better with the Zanaflex  overnight.  She is followed by neurosurgery but contacted them when she developed pain and they said that they could not see her until 06/11/2024.  She denies any bowel/bladder incontinence, lower extremity weakness, saddle anesthesia.  She denies being told that she cannot take NSAIDs but does have a history of renal cell carcinoma with partial nephrectomy.    Past Medical History:  Diagnosis Date   Arthritis    ASCUS of cervix with negative high risk HPV 08/30/2022   08/30/22 repeat pap in 3 years per ASCCP guidelines 5 year risk for CIN 3+ is 0.27%   Back pain    buldging pain   Chronic neck pain    Contraceptive management 10/26/2013   Encounter for screening fecal occult blood testing 08/23/2022   Hematoma 1994   after birth of second child    Hemorrhoids 10/26/2013   History of herpes simplex type 2 infection 10/26/2013   Hormone replacement therapy (HRT) 11/08/2015   Itching in the vaginal area 10/26/2013   Left kidney mass    Leg cramps 10/26/2013   MRSA (methicillin resistant Staphylococcus aureus)    Screening mammogram for breast cancer 08/23/2022   URI (upper respiratory infection) 11/02/2014    Patient Active Problem List   Diagnosis Date Noted   Obesity, Class I, BMI 30-34.9 03/30/2024   Prediabetes 03/30/2024   Dependent edema 09/30/2023   Lumbar spinal stenosis due to adjacent segment disease after fusion procedure 09/17/2023   Renal mass 07/03/2023   Left kidney mass 03/28/2023   ASCUS of cervix with negative high risk HPV 08/30/2022   Wellness examination 03/06/2022   Transaminitis 03/06/2022   Ganglion cyst of wrist, right 01/31/2022   Cervicalgia 01/23/2022   Back pain 01/23/2022   Spondylosis without myelopathy or radiculopathy, cervical region 07/11/2018   Skin tags, multiple acquired 02/17/2018   Displacement of cervical intervertebral disc without myelopathy 07/25/2016   Hormone replacement therapy (HRT) 11/08/2015   Hemorrhoids 10/26/2013    Past Surgical History:  Procedure Laterality Date   CESAREAN SECTION  1998   along with tubal ligation   CHOLECYSTECTOMY  several yrs ago   COLONOSCOPY  09/05/2011   Procedure: COLONOSCOPY;  Surgeon: Pauleen Borne, MD;  Location: [facility];  Service: Endoscopy;  Laterality: N/A;  11:30   COLONOSCOPY WITH PROPOFOL  N/A 07/13/2022   Procedure: COLONOSCOPY WITH PROPOFOL ;  Surgeon: Vinetta Greening, DO;  Location: [facility];  Service: Endoscopy;  Laterality: N/A;  2:00pm, asa 2   ENDOMETRIAL ABLATION  several yrs ago   L4/L5 fusion  09/16/2023   LUMBAR LAMINECTOMY/DECOMPRESSION MICRODISCECTOMY  12/28/2011   Procedure: LUMBAR LAMINECTOMY/DECOMPRESSION MICRODISCECTOMY;  Surgeon: Augustine Blocker, MD;  Location: [facility];  Service: Neurosurgery;   Laterality: Right;  LUMBAR LAMINECTOMY DECOMPRESSION MICRODISCECTOMY LUMBAR 5-SACRAL ONE   NECK SURGERY  2011   NECK SURGERY  08/2016   NECK SURGERY  05/2022   ROBOTIC ASSITED PARTIAL NEPHRECTOMY Left 07/03/2023   Procedure: XI ROBOTIC ASSITED LEFT PARTIAL NEPHRECTOMY;  Surgeon: Adelbert Homans, MD;  Location: [facility];  Service: Urology;  Laterality: Left;  180 MINUTES    OB History     Gravida  3   Para  3   Term      Preterm      AB      Living  3      SAB      IAB      Ectopic      Multiple      Live Births  3            Home Medications    Prior to Admission medications   Medication Sig Start Date End Date Taking? Authorizing Provider  lidocaine  (LIDODERM ) 5 % Place 1 patch onto the skin daily. Remove & Discard patch within 12 hours or as directed by MD 05/06/24  Yes Jacody Beneke, [place] Dales K, PA-C  FYAVOLV  1-5 MG-MCG TABS tablet TAKE 1 TABLET BY MOUTH EVERY DAY 10/18/23   Lendia Quay A, NP  Magnesium  500 MG TABS Take 500 mg by mouth in the morning.    [provider]  predniSONE  (DELTASONE ) 20 MG tablet Take 2 tablets (40 mg total) by mouth daily with breakfast for 5 days. 05/05/24 05/10/24  Leath-Warren, Belen Bowers, NP  tiZANidine  (ZANAFLEX ) 4 MG tablet Take 1 tablet (4 mg total) by mouth every 8 (eight) hours as needed for muscle spasms. 05/05/24   Leath-Warren, Belen Bowers, NP  valACYclovir  (VALTREX ) 1000 MG tablet TAKE 1 TABLET BY MOUTH EVERY DAY 09/03/23   Javan Messing, NP    Family History Family History  Problem Relation Age of Onset   Lung cancer Mother    Lung cancer Father    Diabetes Daughter    Cancer Sister        skin   COPD Sister    Colon cancer Neg Hx    Anesthesia problems Neg Hx    Hypotension Neg Hx    Malignant hyperthermia Neg Hx    Pseudochol deficiency Neg Hx     Social History Social History   Tobacco Use   Smoking status: Every Day    Current packs/day: 1.00    Average packs/day: 1 pack/day for 9.0  years (9.0 ttl pk-yrs)    Types: Cigarettes    Passive exposure: Current   Smokeless tobacco: Never  Vaping Use   Vaping status: Never Used  Substance Use Topics   Alcohol use: Yes    Comment: rarely   Drug use: No     Allergies   Cymbalta [duloxetine hcl]   Review of Systems Review of Systems  Constitutional:  Positive for activity change. Negative for appetite change, fatigue and fever.  Gastrointestinal:  Negative for abdominal pain, diarrhea, nausea and vomiting.  Musculoskeletal:  Positive for back pain. Negative for arthralgias and myalgias.  Neurological:  Negative for weakness and numbness.     Physical Exam Triage Vital Signs ED Triage Vitals  Encounter Vitals Group     BP 05/06/24 0953 136/81     Systolic BP Percentile --      Diastolic BP Percentile --      Pulse Rate 05/06/24 0953 94     Resp 05/06/24 0953 18     Temp 05/06/24 0953 98.6 F (37 C)     Temp Source 05/06/24 0953 Oral     SpO2 05/06/24 0953 97 %     Weight --      Height --      Head Circumference --      Peak Flow --      Pain Score 05/06/24 0954 7     Pain Loc --      Pain Education --      Exclude from Growth Chart --    No data found.  Updated Vital Signs BP 136/81 (BP Location: Right Arm)   Pulse 94   Temp 98.6 F (37 C) (Oral)   Resp 18   SpO2 97%   Visual Acuity Right Eye Distance:   Left Eye Distance:   Bilateral Distance:    Right Eye Near:   Left Eye Near:    Bilateral Near:     Physical Exam Vitals reviewed.  Constitutional:      General: She is awake. She is not in acute distress.    Appearance: Normal appearance. She is well-developed. She is not ill-appearing.     Comments: Very pleasant female appears stated age in no acute distress sitting comfortably in exam room  HENT:     Head: Normocephalic and atraumatic.  Cardiovascular:     Rate and Rhythm: Normal rate and regular rhythm.     Heart sounds: Normal heart sounds, S1 normal and S2 normal. No  murmur heard. Pulmonary:     Effort: Pulmonary effort is normal.     Breath sounds: Normal breath sounds. No wheezing, rhonchi or rales.     Comments: Clear to auscultation bilaterally Musculoskeletal:     Cervical back: No tenderness or bony tenderness.     Thoracic back: No tenderness or bony tenderness.     Lumbar back: Tenderness present. No bony tenderness. Negative right straight leg raise test and negative left straight leg raise test.     Comments: Back: No pain percussion of vertebrae.  No deformity or step-off noted.  Tender to palpation of right lumbar paraspinal muscles without spasm.  Strength 5/5 bilateral lower extremities.  Psychiatric:        Behavior: Behavior is cooperative.      UC Treatments / Results  Labs (all labs ordered are listed, but only abnormal results are displayed) Labs Reviewed - No data to display  EKG   Radiology No results found.  Procedures Procedures (including critical care time)  Medications Ordered in UC Medications - No data to display  Initial Impression / Assessment and Plan / UC Course  I have reviewed the triage vital signs and the nursing notes.  Pertinent labs & imaging results that were available during my care of the patient were reviewed by me and considered in my medical decision making (see chart for details).     Patient is well-appearing, afebrile, nontoxic, nontachycardic.  No alarm symptoms that warrant emergent evaluation.  Lumbar spine x-ray was obtained that showed postsurgical changes without acute osseous abnormality based on my primary read.  At the time of discharge we will waiting for radiologist overread and we will contact her if this differs and changes her treatment plan.  She was encouraged to continue the medication as prescribed at her visit yesterday to help with pain relief.  She was also given lidocaine  patches for additional symptom relief with instruction to apply 1 for 12 hours then remove it for 12  hours using only 1 patch per 24 hours.  Will defer NSAID use due to history of partial nephrectomy.  She can use Tylenol  for breakthrough pain.  We discussed that if anything worsens or changes she should return for reevaluation.  Strict return precautions given.  Excuse note provided.  Final Clinical Impressions(s) / UC Diagnoses   Final diagnoses:  Acute right-sided low back pain with right-sided sciatica     Discharge Instructions      I will contact you with your x-ray results if anything is abnormal.  Please monitor your MyChart.  Continue medication regimen prescribed yesterday to help with your pain.  I have also called in lidocaine  patches that you can apply to that area that is painful.  Use patch for 12 hours during the day and then remove it at night; use only 1 patch per 24 hours.  Follow-up with your neurosurgeon as scheduled.  If anything changes and you have worsening pain, difficulty walking, weakness in your legs, numbness or tingling in your legs, going to the bathroom on yourself without noticing it or trouble passing urine or stool you need to be seen emergently.   ED Prescriptions     Medication Sig Dispense Auth. Provider   lidocaine  (LIDODERM ) 5 % Place 1 patch onto the skin daily. Remove & Discard patch within 12 hours or as directed by MD 30 patch Tiler Brandis K, PA-C      PDMP not reviewed this encounter.   Budd Cargo, PA-C 05/06/24 1017

## 2024-05-06 NOTE — ED Triage Notes (Signed)
 Seen yesterday for back pain.  Wants an x-ray to confirm hardware in back is still good.

## 2024-05-06 NOTE — Discharge Instructions (Signed)
 I will contact you with your x-ray results if anything is abnormal.  Please monitor your MyChart.  Continue medication regimen prescribed yesterday to help with your pain.  I have also called in lidocaine  patches that you can apply to that area that is painful.  Use patch for 12 hours during the day and then remove it at night; use only 1 patch per 24 hours.  Follow-up with your neurosurgeon as scheduled.  If anything changes and you have worsening pain, difficulty walking, weakness in your legs, numbness or tingling in your legs, going to the bathroom on yourself without noticing it or trouble passing urine or stool you need to be seen emergently.

## 2024-08-30 ENCOUNTER — Other Ambulatory Visit: Payer: Self-pay | Admitting: Adult Health
